# Patient Record
Sex: Female | Born: 1935 | Race: White | Hispanic: No | Marital: Single
Health system: Southern US, Community
[De-identification: ages and names within clinical notes are randomized; demographics above are authoritative.]

---

## 2005-05-31 ENCOUNTER — Emergency Department: Payer: Self-pay | Admitting: Unknown Physician Specialty

## 2005-06-03 ENCOUNTER — Emergency Department: Payer: Self-pay | Admitting: Emergency Medicine

## 2005-06-07 ENCOUNTER — Emergency Department: Payer: Self-pay | Admitting: Emergency Medicine

## 2005-06-14 ENCOUNTER — Emergency Department: Payer: Self-pay | Admitting: Emergency Medicine

## 2005-06-28 ENCOUNTER — Emergency Department: Payer: Self-pay | Admitting: Unknown Physician Specialty

## 2005-09-26 ENCOUNTER — Emergency Department: Payer: Self-pay | Admitting: Emergency Medicine

## 2005-09-27 ENCOUNTER — Inpatient Hospital Stay: Payer: Self-pay | Admitting: Internal Medicine

## 2005-09-27 ENCOUNTER — Other Ambulatory Visit: Payer: Self-pay

## 2006-03-09 ENCOUNTER — Ambulatory Visit: Payer: Self-pay

## 2006-03-14 ENCOUNTER — Ambulatory Visit: Payer: Self-pay

## 2006-04-07 ENCOUNTER — Ambulatory Visit: Payer: Self-pay | Admitting: Surgery

## 2006-04-13 ENCOUNTER — Ambulatory Visit: Payer: Self-pay | Admitting: Surgery

## 2006-05-02 ENCOUNTER — Ambulatory Visit: Payer: Self-pay | Admitting: Radiation Oncology

## 2006-05-02 ENCOUNTER — Ambulatory Visit: Payer: Self-pay | Admitting: Internal Medicine

## 2006-05-04 ENCOUNTER — Ambulatory Visit: Payer: Self-pay | Admitting: Radiation Oncology

## 2006-06-04 ENCOUNTER — Ambulatory Visit: Payer: Self-pay | Admitting: Radiation Oncology

## 2006-07-04 ENCOUNTER — Ambulatory Visit: Payer: Self-pay | Admitting: Radiation Oncology

## 2006-08-04 ENCOUNTER — Ambulatory Visit: Payer: Self-pay | Admitting: Radiation Oncology

## 2006-10-04 ENCOUNTER — Ambulatory Visit: Payer: Self-pay | Admitting: Internal Medicine

## 2006-10-10 ENCOUNTER — Ambulatory Visit: Payer: Self-pay | Admitting: Internal Medicine

## 2006-11-04 ENCOUNTER — Ambulatory Visit: Payer: Self-pay | Admitting: Internal Medicine

## 2007-01-04 ENCOUNTER — Ambulatory Visit: Payer: Self-pay | Admitting: Radiation Oncology

## 2007-01-11 ENCOUNTER — Ambulatory Visit: Payer: Self-pay | Admitting: Radiation Oncology

## 2007-02-04 ENCOUNTER — Ambulatory Visit: Payer: Self-pay | Admitting: Radiation Oncology

## 2007-03-16 ENCOUNTER — Ambulatory Visit: Payer: Self-pay | Admitting: Surgery

## 2007-04-04 ENCOUNTER — Ambulatory Visit: Payer: Self-pay | Admitting: Internal Medicine

## 2007-05-04 ENCOUNTER — Ambulatory Visit: Payer: Self-pay | Admitting: Internal Medicine

## 2007-07-04 ENCOUNTER — Ambulatory Visit: Payer: Self-pay | Admitting: Radiation Oncology

## 2007-07-12 ENCOUNTER — Ambulatory Visit: Payer: Self-pay | Admitting: Internal Medicine

## 2007-08-04 ENCOUNTER — Ambulatory Visit: Payer: Self-pay | Admitting: Radiation Oncology

## 2007-10-04 ENCOUNTER — Ambulatory Visit: Payer: Self-pay | Admitting: Internal Medicine

## 2007-10-10 ENCOUNTER — Ambulatory Visit: Payer: Self-pay | Admitting: Internal Medicine

## 2007-11-04 ENCOUNTER — Ambulatory Visit: Payer: Self-pay | Admitting: Internal Medicine

## 2008-02-28 ENCOUNTER — Ambulatory Visit: Payer: Self-pay | Admitting: Otolaryngology

## 2008-03-12 ENCOUNTER — Ambulatory Visit: Payer: Self-pay | Admitting: Otolaryngology

## 2008-03-17 ENCOUNTER — Ambulatory Visit: Payer: Self-pay | Admitting: Surgery

## 2008-04-03 ENCOUNTER — Ambulatory Visit: Payer: Self-pay | Admitting: Internal Medicine

## 2008-04-09 ENCOUNTER — Ambulatory Visit: Payer: Self-pay | Admitting: Internal Medicine

## 2008-05-03 ENCOUNTER — Ambulatory Visit: Payer: Self-pay | Admitting: Internal Medicine

## 2008-07-03 ENCOUNTER — Ambulatory Visit: Payer: Self-pay | Admitting: Internal Medicine

## 2008-07-17 ENCOUNTER — Ambulatory Visit: Payer: Self-pay | Admitting: Internal Medicine

## 2008-08-03 ENCOUNTER — Ambulatory Visit: Payer: Self-pay | Admitting: Internal Medicine

## 2008-09-24 ENCOUNTER — Emergency Department: Payer: Self-pay | Admitting: Emergency Medicine

## 2008-10-01 ENCOUNTER — Emergency Department: Payer: Self-pay | Admitting: Emergency Medicine

## 2008-10-03 ENCOUNTER — Ambulatory Visit: Payer: Self-pay | Admitting: Internal Medicine

## 2008-10-09 ENCOUNTER — Inpatient Hospital Stay: Payer: Self-pay | Admitting: Internal Medicine

## 2008-10-24 ENCOUNTER — Ambulatory Visit: Payer: Self-pay | Admitting: Internal Medicine

## 2008-11-03 ENCOUNTER — Ambulatory Visit: Payer: Self-pay | Admitting: Internal Medicine

## 2009-01-04 ENCOUNTER — Ambulatory Visit: Payer: Self-pay | Admitting: Internal Medicine

## 2009-03-31 ENCOUNTER — Ambulatory Visit: Payer: Self-pay | Admitting: Surgery

## 2010-04-27 ENCOUNTER — Ambulatory Visit: Payer: Self-pay | Admitting: Surgery

## 2010-12-21 IMAGING — CR DG LUMBAR SPINE 2-3V
1 series · 3 of 3 positions shown · non-contrast
Comparison: none

REASON FOR EXAM: sciatica, low back pain
COMMENTS:

[Series 1: view not recorded · 0.17mm/px · 3 of 3 slices shown]
[im 1/3]
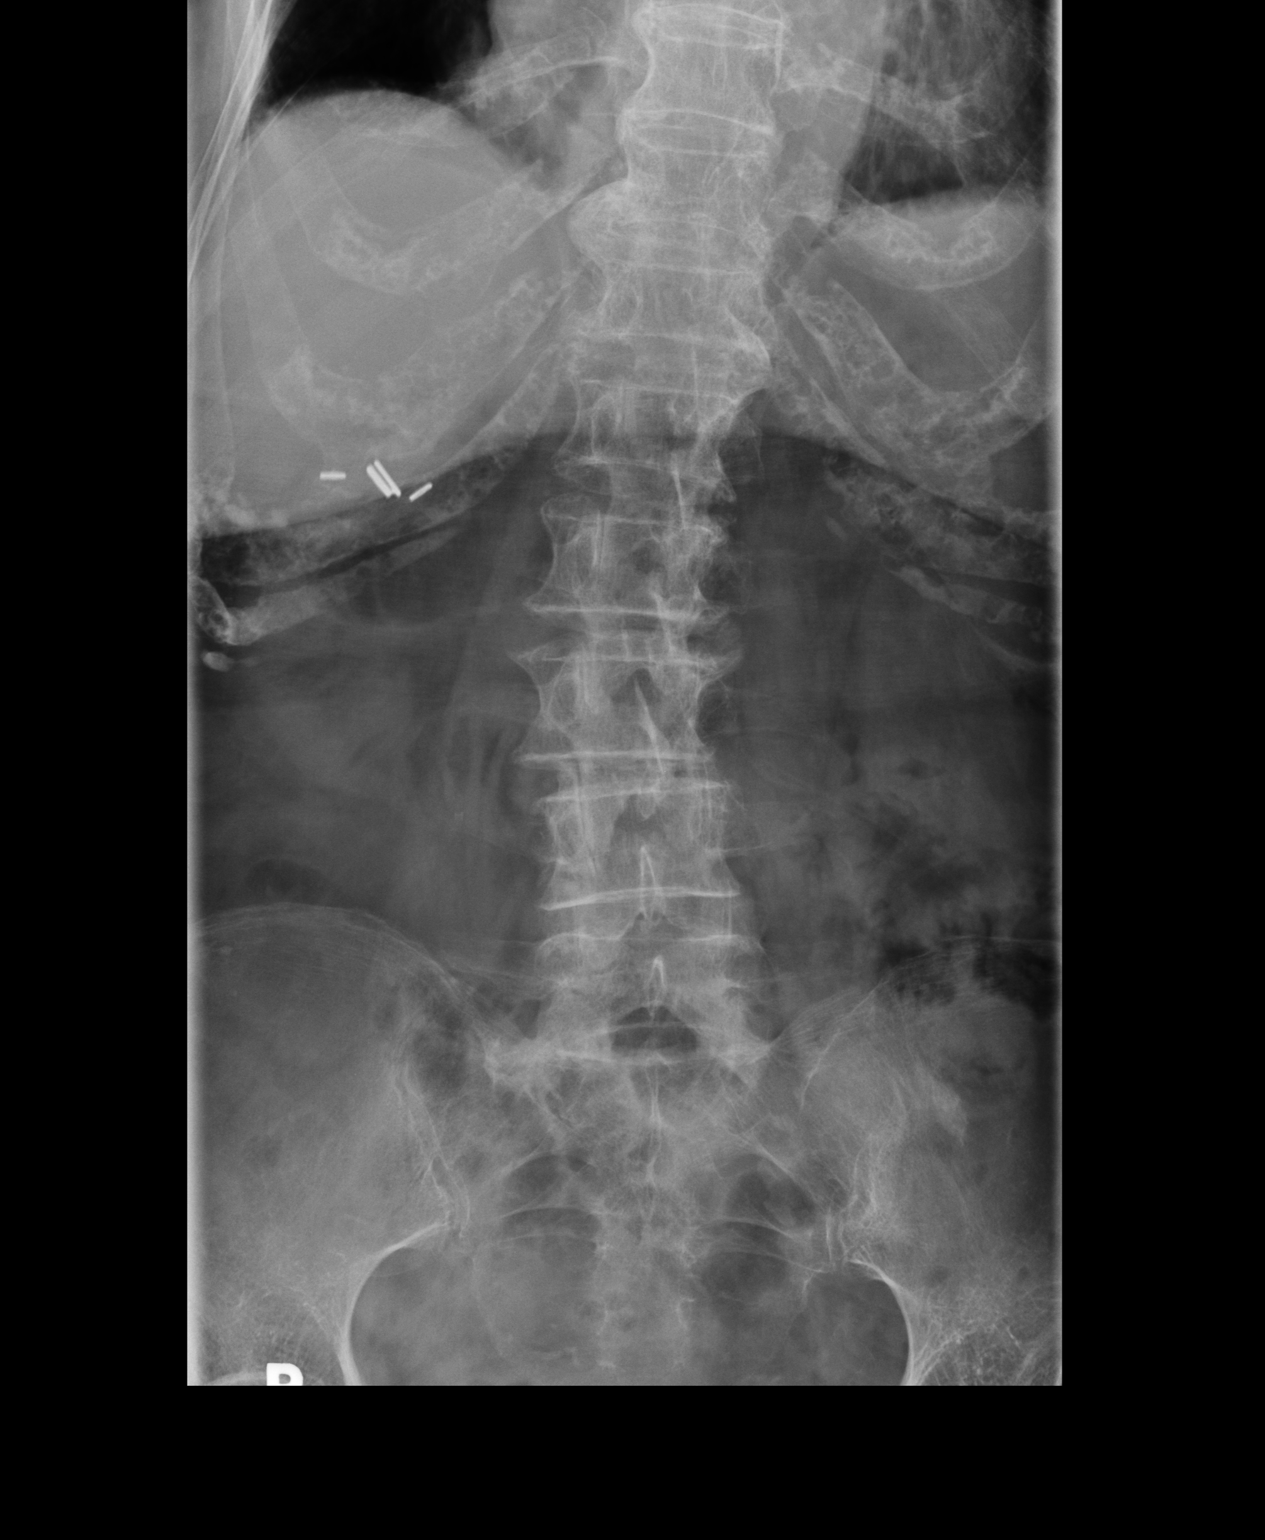
[im 2/3]
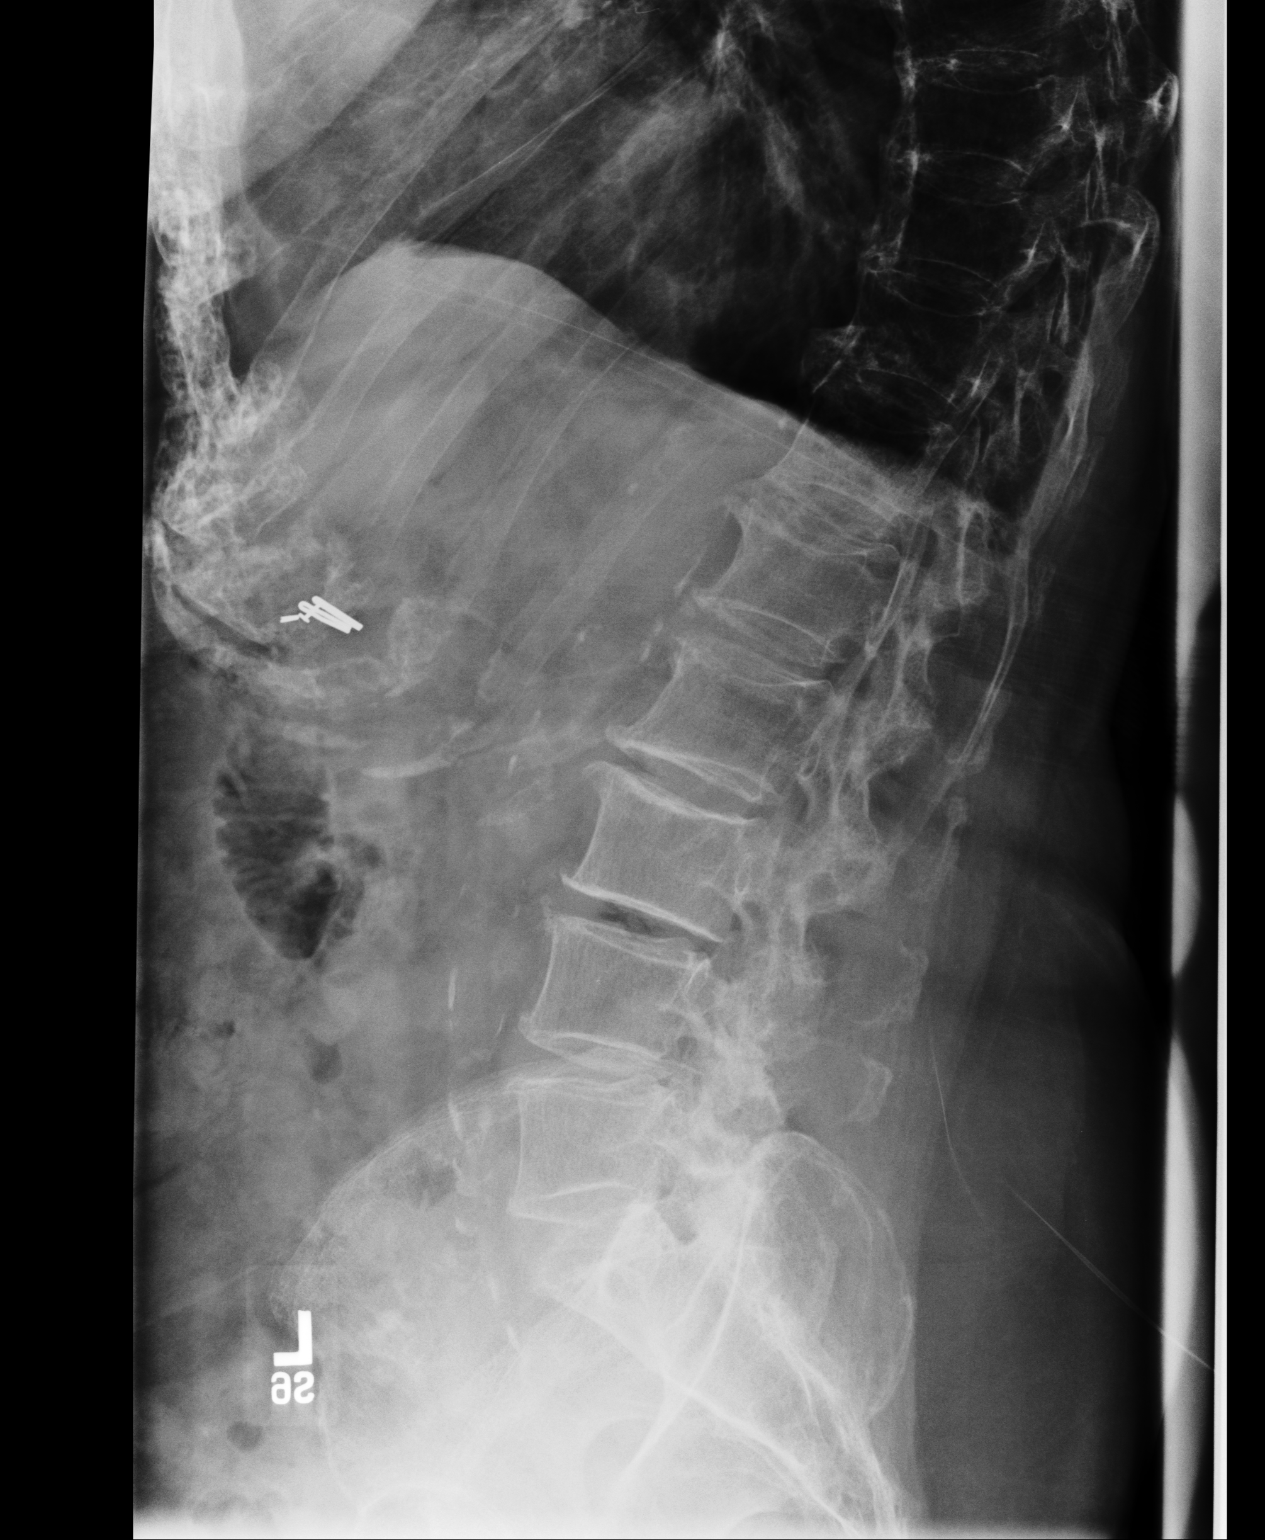
[im 3/3]
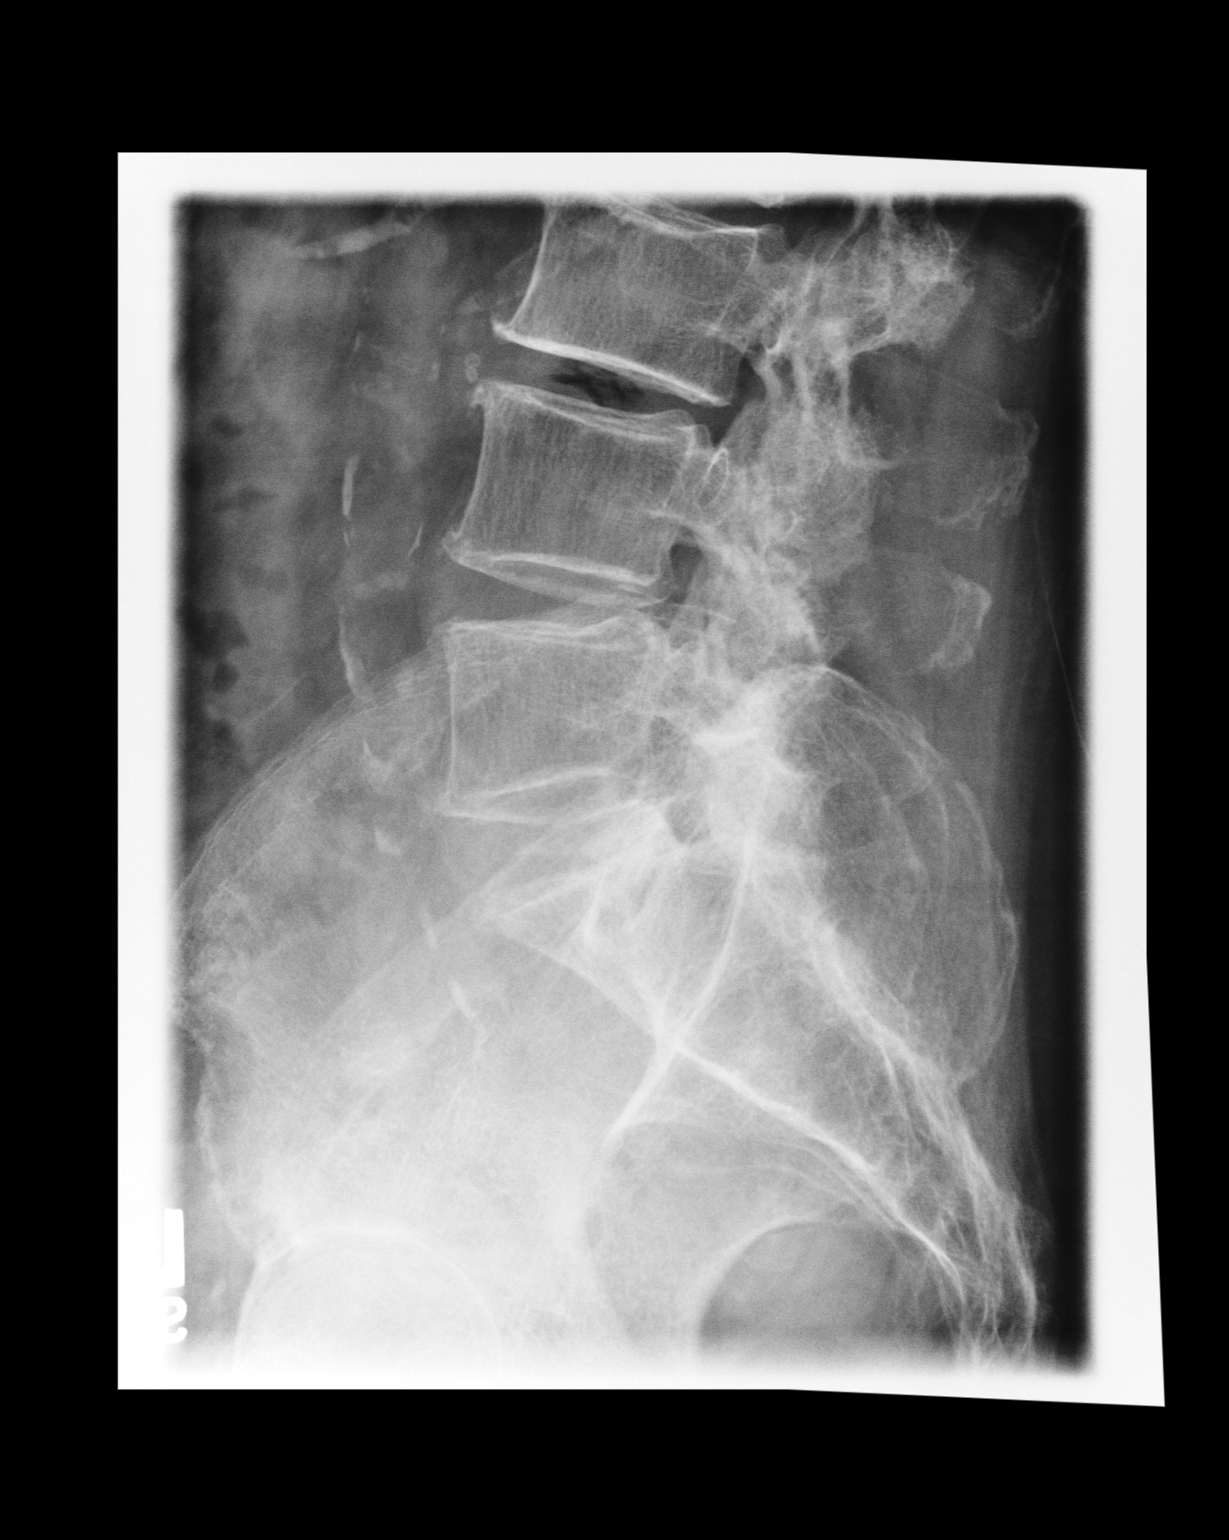

[3 of 3 positions shown; findings below may reference images not displayed]

PROCEDURE:     DXR - DXR LUMBAR SPINE AP AND LATERAL  - September 24, 2008 [DATE]

RESULT:     The lumbar vertebral bodies are preserved in height. There is
diffuse osteopenia. Gentle curvature of the lumbar spine with the convexity
to the right is present. The observed portions of the sacrum are normal.
IMPRESSION: There are mild degenerative disc change at multiple levels
a of the lumbar spine. There is no evidence of compression fracture.
Followup MRI may be useful if the patient's radicular symptoms persist.

## 2011-02-11 ENCOUNTER — Ambulatory Visit: Payer: Self-pay | Admitting: Internal Medicine

## 2011-02-15 ENCOUNTER — Ambulatory Visit: Payer: Self-pay | Admitting: Internal Medicine

## 2011-02-15 LAB — CBC CANCER CENTER
Basophil %: 0.5 %
Eosinophil %: 0.7 %
HGB: 14.8 g/dL (ref 12.0–16.0)
Lymphocyte %: 19 %
MCH: 31.3 pg (ref 26.0–34.0)
MCHC: 33.3 g/dL (ref 32.0–36.0)
Monocyte #: 0.5 x10 3/mm (ref 0.0–0.7)
Monocyte %: 6.9 %
Neutrophil %: 72.9 %
Platelet: 220 x10 3/mm (ref 150–440)

## 2011-02-15 LAB — HEPATIC FUNCTION PANEL A (ARMC)
Bilirubin, Direct: 0.1 mg/dL (ref 0.00–0.20)
Bilirubin,Total: 0.6 mg/dL (ref 0.2–1.0)
SGPT (ALT): 22 U/L

## 2011-02-15 LAB — PROTIME-INR: Prothrombin Time: 18.9 secs — ABNORMAL HIGH (ref 11.5–14.7)

## 2011-02-15 LAB — CREATININE, SERUM: EGFR (African American): >60

## 2011-03-04 ENCOUNTER — Ambulatory Visit: Payer: Self-pay | Admitting: Internal Medicine

## 2011-04-04 ENCOUNTER — Ambulatory Visit: Payer: Self-pay | Admitting: Internal Medicine

## 2011-04-08 LAB — PROTIME-INR: INR: 0.9

## 2011-04-13 LAB — PROTIME-INR: INR: 1.3

## 2011-04-28 ENCOUNTER — Ambulatory Visit: Payer: Self-pay | Admitting: Surgery

## 2011-05-04 ENCOUNTER — Ambulatory Visit: Payer: Self-pay | Admitting: Internal Medicine

## 2011-06-23 ENCOUNTER — Ambulatory Visit: Payer: Self-pay

## 2011-06-29 ENCOUNTER — Ambulatory Visit: Payer: Self-pay | Admitting: Internal Medicine

## 2011-06-29 LAB — CBC CANCER CENTER
Basophil #: 0 x10 3/mm (ref 0.0–0.1)
Basophil %: 0.5 %
Eosinophil #: 0.1 x10 3/mm (ref 0.0–0.7)
Eosinophil %: 1.1 %
HGB: 12.7 g/dL (ref 12.0–16.0)
Lymphocyte %: 15.3 %
MCV: 93 fL (ref 80–100)
Monocyte #: 0.6 x10 3/mm (ref 0.2–0.9)
Neutrophil %: 76 %
Platelet: 268 x10 3/mm (ref 150–440)
RDW: 13.9 % (ref 11.5–14.5)
WBC: 8.1 x10 3/mm (ref 3.6–11.0)

## 2011-06-29 LAB — PROTIME-INR: Prothrombin Time: 19 secs — ABNORMAL HIGH (ref 11.5–14.7)

## 2011-06-29 LAB — HEPATIC FUNCTION PANEL A (ARMC)
Albumin: 3.2 g/dL — ABNORMAL LOW (ref 3.4–5.0)
Alkaline Phosphatase: 84 U/L (ref 50–136)
Bilirubin,Total: 0.6 mg/dL (ref 0.2–1.0)
SGOT(AST): 22 U/L (ref 15–37)
SGPT (ALT): 28 U/L

## 2011-06-29 LAB — CREATININE, SERUM
Creatinine: 0.86 mg/dL (ref 0.60–1.30)
EGFR (African American): >60
EGFR (Non-African Amer.): 59 — ABNORMAL LOW

## 2011-07-04 ENCOUNTER — Ambulatory Visit: Payer: Self-pay | Admitting: Internal Medicine

## 2011-08-04 LAB — URINALYSIS, COMPLETE
Bilirubin,UR: NEGATIVE
Nitrite: NEGATIVE
Protein: 100
RBC,UR: 95 /HPF (ref 0–5)
Specific Gravity: 1.015 (ref 1.003–1.030)

## 2011-08-04 LAB — CBC
HCT: 40.4 % (ref 35.0–47.0)
HGB: 13.9 g/dL (ref 12.0–16.0)
MCH: 31.4 pg (ref 26.0–34.0)
Platelet: 138 10*3/uL — ABNORMAL LOW (ref 150–440)
RBC: 4.42 10*6/uL (ref 3.80–5.20)
WBC: 23.6 10*3/uL — ABNORMAL HIGH (ref 3.6–11.0)

## 2011-08-04 LAB — BASIC METABOLIC PANEL
BUN: 32 mg/dL — ABNORMAL HIGH (ref 7–18)
Calcium, Total: 8.5 mg/dL (ref 8.5–10.1)
Chloride: 106 mmol/L (ref 98–107)
Co2: 26 mmol/L (ref 21–32)
Creatinine: 1.24 mg/dL (ref 0.60–1.30)
EGFR (African American): 44 — ABNORMAL LOW
EGFR (Non-African Amer.): 38 — ABNORMAL LOW
Osmolality: 297 (ref 275–301)

## 2011-08-04 LAB — CK TOTAL AND CKMB (NOT AT ARMC)
CK, Total: 356 U/L — ABNORMAL HIGH (ref 21–215)
CK-MB: 3.2 ng/mL (ref 0.5–3.6)

## 2011-08-04 LAB — TROPONIN I: Troponin-I: 0.33 ng/mL — ABNORMAL HIGH

## 2011-08-05 ENCOUNTER — Inpatient Hospital Stay: Payer: Self-pay | Admitting: Family Medicine

## 2011-08-05 LAB — CK: CK, Total: 198 U/L (ref 21–215)

## 2011-08-05 LAB — TROPONIN I: Troponin-I: 0.23 ng/mL — ABNORMAL HIGH

## 2011-08-06 LAB — CBC WITH DIFFERENTIAL/PLATELET
Basophil #: 0 10*3/uL (ref 0.0–0.1)
Basophil %: 0.1 %
HGB: 12.8 g/dL (ref 12.0–16.0)
Lymphocyte %: 5.7 %
MCH: 30.9 pg (ref 26.0–34.0)
MCHC: 33.7 g/dL (ref 32.0–36.0)
MCV: 92 fL (ref 80–100)
Monocyte #: 0.7 x10 3/mm (ref 0.2–0.9)
Neutrophil #: 13.8 10*3/uL — ABNORMAL HIGH (ref 1.4–6.5)
Neutrophil %: 88.7 %
RBC: 4.14 10*6/uL (ref 3.80–5.20)
RDW: 14.6 % — ABNORMAL HIGH (ref 11.5–14.5)

## 2011-08-06 LAB — BUN: BUN: 27 mg/dL — ABNORMAL HIGH (ref 7–18)

## 2011-08-06 LAB — MAGNESIUM: Magnesium: 1.8 mg/dL

## 2011-08-09 LAB — URINE CULTURE

## 2012-04-30 ENCOUNTER — Ambulatory Visit: Payer: Self-pay | Admitting: Internal Medicine

## 2012-05-02 ENCOUNTER — Ambulatory Visit: Payer: Self-pay | Admitting: Internal Medicine

## 2012-07-11 ENCOUNTER — Inpatient Hospital Stay: Payer: Self-pay | Admitting: Internal Medicine

## 2012-07-11 LAB — COMPREHENSIVE METABOLIC PANEL WITH GFR
Albumin: 2.5 g/dL — ABNORMAL LOW
Alkaline Phosphatase: 74 U/L
Anion Gap: 7
BUN: 26 mg/dL — ABNORMAL HIGH
Bilirubin,Total: 0.8 mg/dL
Calcium, Total: 9.1 mg/dL
Chloride: 106 mmol/L
Co2: 29 mmol/L
Creatinine: 0.94 mg/dL
EGFR (African American): >60
EGFR (Non-African Amer.): 53 — ABNORMAL LOW
Glucose: 97 mg/dL
Osmolality: 288
Potassium: 3.2 mmol/L — ABNORMAL LOW
SGOT(AST): 19 U/L
SGPT (ALT): 16 U/L
Sodium: 142 mmol/L
Total Protein: 6.3 g/dL — ABNORMAL LOW

## 2012-07-11 LAB — CBC
HCT: 34.2 % — ABNORMAL LOW (ref 35.0–47.0)
HGB: 11.3 g/dL — ABNORMAL LOW (ref 12.0–16.0)
MCH: 30.2 pg (ref 26.0–34.0)
MCHC: 33 g/dL (ref 32.0–36.0)
Platelet: 197 10*3/uL (ref 150–440)
WBC: 9.8 10*3/uL (ref 3.6–11.0)

## 2012-07-11 LAB — URINALYSIS, COMPLETE: Squamous Epithelial: NONE SEEN

## 2012-07-11 LAB — TROPONIN I: Troponin-I: <0.02 ng/mL

## 2012-07-11 LAB — PROTIME-INR: Prothrombin Time: 35.6 secs — ABNORMAL HIGH (ref 11.5–14.7)

## 2012-07-12 LAB — CBC WITH DIFFERENTIAL/PLATELET
Basophil %: 0.2 %
Eosinophil #: 0 10*3/uL (ref 0.0–0.7)
Eosinophil %: 0.1 %
HCT: 33.6 % — ABNORMAL LOW (ref 35.0–47.0)
Lymphocyte #: 1.1 10*3/uL (ref 1.0–3.6)
Lymphocyte %: 10.8 %
MCH: 31 pg (ref 26.0–34.0)
MCHC: 34.1 g/dL (ref 32.0–36.0)
Monocyte #: 0.9 x10 3/mm (ref 0.2–0.9)
Neutrophil #: 8.4 10*3/uL — ABNORMAL HIGH (ref 1.4–6.5)
Platelet: 206 10*3/uL (ref 150–440)
RDW: 14.6 % — ABNORMAL HIGH (ref 11.5–14.5)
WBC: 10.4 10*3/uL (ref 3.6–11.0)

## 2012-07-12 LAB — BASIC METABOLIC PANEL
Chloride: 107 mmol/L (ref 98–107)
Co2: 27 mmol/L (ref 21–32)
Creatinine: 1.07 mg/dL (ref 0.60–1.30)
EGFR (Non-African Amer.): 45 — ABNORMAL LOW
Potassium: 3.3 mmol/L — ABNORMAL LOW (ref 3.5–5.1)

## 2012-07-13 LAB — CBC WITH DIFFERENTIAL/PLATELET
Basophil %: 0.2 %
Eosinophil #: 0 10*3/uL (ref 0.0–0.7)
HCT: 32.7 % — ABNORMAL LOW (ref 35.0–47.0)
HGB: 10.9 g/dL — ABNORMAL LOW (ref 12.0–16.0)
Lymphocyte #: 1 10*3/uL (ref 1.0–3.6)
MCV: 91 fL (ref 80–100)
Monocyte #: 0.8 x10 3/mm (ref 0.2–0.9)
Monocyte %: 6.9 %
Platelet: 216 10*3/uL (ref 150–440)
RBC: 3.59 10*6/uL — ABNORMAL LOW (ref 3.80–5.20)

## 2012-07-13 LAB — BASIC METABOLIC PANEL
Anion Gap: 8 (ref 7–16)
BUN: 20 mg/dL — ABNORMAL HIGH (ref 7–18)
Calcium, Total: 9.1 mg/dL (ref 8.5–10.1)
Chloride: 107 mmol/L (ref 98–107)
Creatinine: 0.93 mg/dL (ref 0.60–1.30)
EGFR (Non-African Amer.): 54 — ABNORMAL LOW
Glucose: 102 mg/dL — ABNORMAL HIGH (ref 65–99)
Osmolality: 282 (ref 275–301)
Potassium: 3.6 mmol/L (ref 3.5–5.1)
Sodium: 140 mmol/L (ref 136–145)

## 2012-07-13 LAB — URINE CULTURE

## 2012-07-13 LAB — PROTIME-INR: Prothrombin Time: 38.6 secs — ABNORMAL HIGH (ref 11.5–14.7)

## 2012-07-14 LAB — BASIC METABOLIC PANEL
Anion Gap: 3 — ABNORMAL LOW (ref 7–16)
BUN: 16 mg/dL (ref 7–18)
Calcium, Total: 8.9 mg/dL (ref 8.5–10.1)
Chloride: 112 mmol/L — ABNORMAL HIGH (ref 98–107)
Co2: 29 mmol/L (ref 21–32)
Creatinine: 1.02 mg/dL (ref 0.60–1.30)
EGFR (African American): 56 — ABNORMAL LOW
Glucose: 87 mg/dL (ref 65–99)
Osmolality: 287 (ref 275–301)

## 2012-07-14 LAB — CBC WITH DIFFERENTIAL/PLATELET
Basophil #: 0 10*3/uL (ref 0.0–0.1)
Basophil %: 0.4 %
HGB: 10.7 g/dL — ABNORMAL LOW (ref 12.0–16.0)
Lymphocyte #: 1.2 10*3/uL (ref 1.0–3.6)
Lymphocyte %: 15.7 %
MCH: 30.6 pg (ref 26.0–34.0)
MCV: 92 fL (ref 80–100)
Monocyte %: 8.5 %
Neutrophil #: 5.8 10*3/uL (ref 1.4–6.5)
Neutrophil %: 73.9 %
RDW: 14.2 % (ref 11.5–14.5)
WBC: 7.8 10*3/uL (ref 3.6–11.0)

## 2012-07-14 LAB — PROTIME-INR
INR: 2.5
Prothrombin Time: 26.2 secs — ABNORMAL HIGH (ref 11.5–14.7)

## 2012-07-15 LAB — PROTIME-INR
INR: 1.6
Prothrombin Time: 18.7 secs — ABNORMAL HIGH (ref 11.5–14.7)

## 2012-07-16 LAB — CULTURE, BLOOD (SINGLE)

## 2012-08-28 ENCOUNTER — Emergency Department: Payer: Self-pay | Admitting: Emergency Medicine

## 2012-08-30 ENCOUNTER — Inpatient Hospital Stay: Payer: Self-pay | Admitting: Internal Medicine

## 2012-08-30 LAB — CBC WITH DIFFERENTIAL/PLATELET
Basophil %: 0.4 %
Eosinophil %: 0.2 %
HCT: 36.4 % (ref 35.0–47.0)
HGB: 12.2 g/dL (ref 12.0–16.0)
HGB: 13.7 g/dL (ref 12.0–16.0)
Lymphocyte #: 0.9 10*3/uL — ABNORMAL LOW (ref 1.0–3.6)
Lymphocyte #: 1 10*3/uL (ref 1.0–3.6)
Lymphocyte %: 8.9 %
MCH: 31 pg (ref 26.0–34.0)
MCHC: 33.5 g/dL (ref 32.0–36.0)
MCV: 93 fL (ref 80–100)
MCV: 94 fL (ref 80–100)
Monocyte #: 0.8 x10 3/mm (ref 0.2–0.9)
Monocyte %: 6.6 %
Neutrophil #: 10.6 10*3/uL — ABNORMAL HIGH (ref 1.4–6.5)
Neutrophil %: 84.1 %
Platelet: 209 10*3/uL (ref 150–440)
RBC: 3.92 10*6/uL (ref 3.80–5.20)
RDW: 15.7 % — ABNORMAL HIGH (ref 11.5–14.5)
WBC: 12.4 10*3/uL — ABNORMAL HIGH (ref 3.6–11.0)

## 2012-08-30 LAB — URINALYSIS, COMPLETE
Bilirubin,UR: NEGATIVE
Glucose,UR: NEGATIVE mg/dL (ref 0–75)
Hyaline Cast: 2
Ph: 6 (ref 4.5–8.0)
RBC,UR: 30 /HPF (ref 0–5)
Specific Gravity: 1.017 (ref 1.003–1.030)
Squamous Epithelial: 1

## 2012-08-30 LAB — COMPREHENSIVE METABOLIC PANEL
BUN: 20 mg/dL — ABNORMAL HIGH (ref 7–18)
Bilirubin,Total: 0.4 mg/dL (ref 0.2–1.0)
Chloride: 103 mmol/L (ref 98–107)
Co2: 28 mmol/L (ref 21–32)
Creatinine: 1.17 mg/dL (ref 0.60–1.30)
EGFR (African American): 47 — ABNORMAL LOW
SGOT(AST): 21 U/L (ref 15–37)
SGPT (ALT): 18 U/L (ref 12–78)
Sodium: 139 mmol/L (ref 136–145)
Total Protein: 6.4 g/dL (ref 6.4–8.2)

## 2012-08-30 LAB — BASIC METABOLIC PANEL
Co2: 26 mmol/L (ref 21–32)
EGFR (African American): 55 — ABNORMAL LOW
EGFR (Non-African Amer.): 47 — ABNORMAL LOW
Osmolality: 280 (ref 275–301)
Potassium: 3.6 mmol/L (ref 3.5–5.1)
Sodium: 139 mmol/L (ref 136–145)

## 2012-08-30 LAB — PROTIME-INR: INR: 1

## 2012-08-31 LAB — BASIC METABOLIC PANEL
Anion Gap: 7 (ref 7–16)
Chloride: 109 mmol/L — ABNORMAL HIGH (ref 98–107)
Creatinine: 0.93 mg/dL (ref 0.60–1.30)
EGFR (African American): >60
Glucose: 98 mg/dL (ref 65–99)
Potassium: 4.1 mmol/L (ref 3.5–5.1)
Sodium: 140 mmol/L (ref 136–145)

## 2012-08-31 LAB — CBC WITH DIFFERENTIAL/PLATELET
Basophil #: 0 10*3/uL (ref 0.0–0.1)
Eosinophil #: 0 10*3/uL (ref 0.0–0.7)
Eosinophil %: 0.5 %
HGB: 11 g/dL — ABNORMAL LOW (ref 12.0–16.0)
Lymphocyte #: 0.7 10*3/uL — ABNORMAL LOW (ref 1.0–3.6)
Lymphocyte %: 7.6 %
MCHC: 33.7 g/dL (ref 32.0–36.0)
Monocyte #: 1.1 x10 3/mm — ABNORMAL HIGH (ref 0.2–0.9)
Monocyte %: 11.1 %
Neutrophil #: 7.9 10*3/uL — ABNORMAL HIGH (ref 1.4–6.5)

## 2012-09-01 LAB — HEMOGLOBIN: HGB: 11.7 g/dL — ABNORMAL LOW (ref 12.0–16.0)

## 2012-09-02 LAB — CBC WITH DIFFERENTIAL/PLATELET
Basophil #: 0.1 10*3/uL (ref 0.0–0.1)
Basophil %: 0.6 %
Eosinophil #: 0.1 10*3/uL (ref 0.0–0.7)
Eosinophil %: 1.3 %
HGB: 12 g/dL (ref 12.0–16.0)
Lymphocyte #: 1.1 10*3/uL (ref 1.0–3.6)
MCH: 31.8 pg (ref 26.0–34.0)
MCHC: 34 g/dL (ref 32.0–36.0)
Monocyte #: 0.8 x10 3/mm (ref 0.2–0.9)
Monocyte %: 7.6 %
Neutrophil #: 8.4 10*3/uL — ABNORMAL HIGH (ref 1.4–6.5)
RDW: 16 % — ABNORMAL HIGH (ref 11.5–14.5)
WBC: 10.5 10*3/uL (ref 3.6–11.0)

## 2012-09-03 ENCOUNTER — Ambulatory Visit: Payer: Self-pay | Admitting: Nurse Practitioner

## 2012-09-03 LAB — CBC WITH DIFFERENTIAL/PLATELET
Basophil %: 0.8 %
Eosinophil #: 0.2 10*3/uL (ref 0.0–0.7)
Eosinophil %: 2 %
HCT: 34.1 % — ABNORMAL LOW (ref 35.0–47.0)
HGB: 11.4 g/dL — ABNORMAL LOW (ref 12.0–16.0)
Lymphocyte %: 12.5 %
MCH: 31.6 pg (ref 26.0–34.0)
MCV: 94 fL (ref 80–100)
Monocyte %: 9 %
Neutrophil #: 6.8 10*3/uL — ABNORMAL HIGH (ref 1.4–6.5)
Neutrophil %: 75.7 %
Platelet: 207 10*3/uL (ref 150–440)
RDW: 15.9 % — ABNORMAL HIGH (ref 11.5–14.5)
WBC: 8.9 10*3/uL (ref 3.6–11.0)

## 2012-09-03 LAB — BASIC METABOLIC PANEL
Anion Gap: 5 — ABNORMAL LOW (ref 7–16)
BUN: 19 mg/dL — ABNORMAL HIGH (ref 7–18)
Calcium, Total: 8.9 mg/dL (ref 8.5–10.1)
Chloride: 109 mmol/L — ABNORMAL HIGH (ref 98–107)
Creatinine: 0.75 mg/dL (ref 0.60–1.30)
EGFR (African American): >60
Glucose: 109 mg/dL — ABNORMAL HIGH (ref 65–99)
Osmolality: 284 (ref 275–301)
Potassium: 3.3 mmol/L — ABNORMAL LOW (ref 3.5–5.1)
Sodium: 141 mmol/L (ref 136–145)

## 2012-09-04 LAB — CBC WITH DIFFERENTIAL/PLATELET
Basophil #: 0.1 10*3/uL (ref 0.0–0.1)
HCT: 33.6 % — ABNORMAL LOW (ref 35.0–47.0)
HGB: 11.2 g/dL — ABNORMAL LOW (ref 12.0–16.0)
Lymphocyte #: 1.2 10*3/uL (ref 1.0–3.6)
MCH: 31.4 pg (ref 26.0–34.0)
MCV: 95 fL (ref 80–100)
Neutrophil #: 6.3 10*3/uL (ref 1.4–6.5)
Neutrophil %: 74.1 %
Platelet: 209 10*3/uL (ref 150–440)
RDW: 15.8 % — ABNORMAL HIGH (ref 11.5–14.5)
WBC: 8.5 10*3/uL (ref 3.6–11.0)

## 2012-09-04 LAB — BASIC METABOLIC PANEL
Anion Gap: 2 — ABNORMAL LOW (ref 7–16)
BUN: 22 mg/dL — ABNORMAL HIGH (ref 7–18)
Calcium, Total: 9.2 mg/dL (ref 8.5–10.1)
Chloride: 109 mmol/L — ABNORMAL HIGH (ref 98–107)
Co2: 30 mmol/L (ref 21–32)
Creatinine: 0.81 mg/dL (ref 0.60–1.30)
EGFR (African American): >60
Glucose: 106 mg/dL — ABNORMAL HIGH (ref 65–99)
Osmolality: 285 (ref 275–301)
Sodium: 141 mmol/L (ref 136–145)

## 2012-10-03 DEATH — deceased

## 2014-04-22 NOTE — H&P (Signed)
PATIENT NAME:  Jasmin Wang, Jasmin Wang MR#:  [id number] DATE OF BIRTH:  1919/06/17  DATE OF ADMISSION:  08/05/2011  PRIMARY CARE PHYSICIAN:  Dr. Yates Decamp REFERRING PHYSICIAN:  Dr. Margarita Grizzle  CHIEF COMPLAINT: Fall yesterday.   HISTORY OF PRESENT ILLNESS: The patient is a 81 year old Caucasian female with a past medical history of early stage right breast cancer, DCIS, hypertension, vertigo, bronchitis, history of right leg deep venous thrombosis on Coumadin for about two years, who presented to the ED with fall yesterday. The patient is alert, awake, oriented but has hearing loss. According to her and her nephew, the patient fell by accident at home. She said she tripped over a chair by accident and fell, but she denies any syncope, loss of consciousness, or seizure. She feels dizzy but denies any other symptoms. According to her nephew, the patient fell before about three months ago and got bruises on her face due to taking Coumadin so Coumadin was stopped temporarily. Then the patient was resumed Coumadin later. In the ED the patient was noted to have a urinary tract infection and dehydration.  She was treated with Rocephin and IV fluid support.   PAST MEDICAL HISTORY:  1. Breast cancer.  2. Hypertension. 3. Vertigo. 4. Right leg deep venous thrombosis  5. Bronchitis.   PAST SURGICAL HISTORY:  1. Right breast cancer lumpectomy in 2008.  2. Cholecystectomy.  3. Appendectomy.  4. Cataract surgery.  5. Hernia repair.   FAMILY HISTORY:  Hypertension.   SOCIAL HISTORY: No smoking, alcohol drinking, or illicit drugs. The patient lives alone.    REVIEW OF SYSTEMS: CONSTITUTIONAL: The patient denies any fever or chills. No headache but has dizziness. No weakness. EYES: No double vision, blurred vision, but has history of cataract. ENT: No epistaxis, postnasal drip, dysphagia, or slurred speech. RESPIRATORY: No cough, sputum, shortness of breath, or hemoptysis. CARDIOVASCULAR: No chest pain, palpitation,  or orthopnea. No nocturnal dyspnea. No leg edema.  GI: No abdominal pain, nausea, vomiting, or diarrhea. No melena or bloody stool. GU: No dysuria or hematuria. No incontinence. ENDOCRINE: No polyuria or polydipsia. HEMATOLOGY: No easy bruising or bleeding. NEURO: No syncope, loss of consciousness, or seizure.   ALLERGIES:  Codeine.  HOME MEDICATIONS:  1. Vitamin D3 2000 units p.o. daily.  2. Vitamin C 500 mg p.o. daily.  3. Klor-Con M 20 tablets 1 tablet p.o. daily.  4. Lasix 20 mg p.o. daily.  5. Coumadin 1 mg p.o. daily.   PHYSICAL EXAMINATION:  VITALS: Temperature 98.4, blood pressure 102/50, pulse 67, respirations 18, oxygen saturation 97% on room air.   GENERAL: The patient is alert, awake, oriented, in no acute distress.   HEENT: Pupils round, equal, and reactive to light and accommodation.   NECK: Supple. No JVD or carotid bruit. No lymphadenopathy. No thyromegaly. Dry oral mucosa. Clear oropharynx.   CARDIOVASCULAR: S1, S2, regular rate and rhythm. No murmurs or gallops.   PULMONARY: Bilateral air entry. No wheezing or rales. No use of accessory muscles to breathe.   ABDOMEN: Soft. No distention or tenderness. No organomegaly. Bowel sounds present.   EXTREMITIES: No edema, clubbing, or cyanosis. No calf tenderness. Strong pedal pulses.   SKIN: No rash or jaundice.   NEUROLOGIC: Alert and oriented times three. No focal deficits. Deep tendon reflexes 2+. Power 5/5. Sensation intact.  LABORATORY, DIAGNOSTIC, AND RADIOLOGICAL DATA: Urinalysis shows RBC 95, WBC 74, nitrite negative. Glucose 123, BUN 32, creatinine 1.24, potassium 3.3, chloride 106, bicarbonate 26. WBC 23.6, hemoglobin 13.9, platelets 138. Troponin  0.33, CK 356. EKG shows sinus rhythm with marked sinus arrhythmia at 76 beats per minute.   IMPRESSION:  1. Fall.  2. Urinary tract infection.  3. Leukocytosis.  4. Dehydration.  5. Hypokalemia.  6. Elevated troponin.   PLAN OF TREATMENT:  1. The patient will  be admitted to the medical floor. We will start fall precautions. Hold Coumadin due to high risk of bleeding and high risk of falls, but we will start aspirin 325 mg p.o. daily due to elevated troponin. Also we will follow up troponin level. Elevated troponin level is possibly due to mild rhabdomyolysis because of fall.  2. For urinary tract infection we will continue Rocephin and follow up a urine culture and CBC.  3. For borderline low blood pressure and dehydration, we will start normal saline and follow up BMP.  4. For hypokalemia, we will give Klor-Con,  follow up BMP and magnesium level.  5. The patient is a 81 year old Caucasian female living alone and may need nursing home placement. I discussed the patient's situation and plan of treatment with the patient and the patient's nephew.   TIME SPENT: About 55 minutes.   ____________________________ Shaune PollackQing Asjah Rauda, MD qc:bjt D:  08/05/2011 01:36:38 ET         T: 08/05/2011 06:53:49 ET          JOB#: [id number]  cc: Jonny RuizJohn B. Danne HarborWalker III, MD Shaune PollackQING Kierria Feigenbaum MD ELECTRONICALLY SIGNED 08/06/2011 16:43

## 2014-04-22 NOTE — Discharge Summary (Signed)
PATIENT NAME:  Jasmin Wang, Jasmin Wang MR#:  [id number] DATE OF BIRTH:  February 21, 1919  DATE OF ADMISSION:  08/05/2011 DATE OF DISCHARGE:  08/08/2011   DISCHARGE DIAGNOSES:  1. Klebsiella urinary tract infection with metabolic encephalopathy.  2. History of falls risk.  3. History of hypertension.  4. History of deep vein thrombosis, on Coumadin.   DISCHARGE MEDICATIONS:  1. Vitamin D3 2000 international units daily.  2. Vitamin C 500 mg p.o. daily.  3. Furosemide 20 mg p.o. daily.  4. Potassium chloride 20 mEq p.o. daily. 5. Coumadin 1 mg p.o. daily.  6. Artificial tears as directed.  7. Cipro 250 p.o. b.i.d. x7 days.  CONSULTS: None.   PROCEDURES: None.   PERTINENT LABS: Noncontributory.   BRIEF HOSPITAL COURSE:  1. Encephalopathy secondary to urinary tract infection. The patient initially came in with concerns of altered mental status secondary to acute urinary tract infection seen on her labs. She had a white blood cell count of 23.6 upon admission with a urinalysis that was positive for white blood cells and red blood cells. She was treated with Rocephin upon her admission. She was transitioned over to oral Cipro prior to discharge. During her hospitalization she was evaluated by physical therapy who noted she was an increased falls risk. She is going to be transferred to [facility] further rehab and evaluation.  2. Other chronic medical issues are stable. Continue those home regimens.   DISPOSITION: She is in stable condition to be discharged to [facility] for further rehab and therapy for her falls risk. She needs follow-up with Dr. Dan HumphreysWalker within one week.    ____________________________ Marisue IvanKanhka Keyontae Huckeby, MD kl:drc D: 08/08/2011 12:40:51 ET T: 08/08/2011 12:57:35 ET JOB#: [id number]  cc: Marisue IvanKanhka Britt Theard, MD, <Dictator> Marisue IvanKANHKA Tyrhonda Georgiades MD ELECTRONICALLY SIGNED 09/01/2011 11:00

## 2014-04-25 NOTE — Op Note (Signed)
PATIENT NAME:  Jasmin BaldyHOMAS, Blandina MR#:  [id number] DATE OF BIRTH:  05/18/1919  DATE OF PROCEDURE:  08/30/2012  PREOPERATIVE DIAGNOSIS:  High intertroch/low basilar neck, right hip fracture.   POSTOPERATIVE DIAGNOSIS:  High intertroch/low basilar neck, right hip fracture.   PROCEDURE: ORIF, right intertrochanteric hip fracture.   ANESTHESIA: Spinal.   SURGEON: Leitha SchullerMichael J. Staphanie Harbison, M.D.   DESCRIPTION OF PROCEDURE: The patient was brought to the operating room and after adequate spinal anesthesia was obtained, the patient was placed on the fracture table with the left leg in well legholder.  The right foot was in the traction boot with traction applied. C-arm was brought in and essentially anatomic alignment was obtained in both AP and lateral projections. After prepping and draping, using the barrier drape method, a timeout procedure was completed. A proximal incision was made just proximal to the greater trochanter and a guidewire was inserted into the tip of the trochanter. Proximal reaming was carried out, and then a long guidewire inserted down the canal for the Affixus rod.  11 mm reaming did give some chatter in the mid canal, so a 9 mm rod was chosen. Depth based off of the guidewire that had been inserted. The 9 x 300 mm long Affixus right rod was then inserted down the canal and set at the appropriate level. A lateral incision was made, and a guidewire inserted in a center/center position, along with the drill for the antirotation screw to try to prevent rotation with the high intertroch fracture. The reaming was carried out over the guidewire with 100 mm screw chosen. The screw was inserted, and traction released. The screw was set and tightened and then quarter turned to release the tension on the screw to allow for sliding compression fixation. An antirotation screw was inserted; however, the bone was so soft it came out with the screwdriver, and so it was left aside, as the drill had done its job of  preventing rotation of the proximal fragment. The wound was thoroughly irrigated. The deep tissue was closed using #1 Vicryl, 2-0 Vicryl subQ and skin staples. Xeroform, 4 x 4's, ABD  and tape applied. The patient was sent to the recovery room in stable condition.   ESTIMATED BLOOD LOSS: 200 mL.   COMPLICATIONS: None.   SPECIMEN: None.   IMPLANTS: [employer]omet Affixus right long femoral rod 9 x 300 mm with a 100 mm lag screw.     ____________________________ Leitha SchullerMichael J. Ryen Rhames, MD mjm:dmm D: 08/30/2012 16:04:36 ET T: 08/30/2012 20:17:47 ET JOB#: [id number]  cc: Leitha SchullerMichael J. Yania Bogie, MD, <Dictator> Leitha SchullerMICHAEL J Onya Eutsler MD ELECTRONICALLY SIGNED 09/02/2012 18:34

## 2014-04-25 NOTE — H&P (Signed)
PATIENT NAME:  Jasmin Wang, Jasmin Wang MR#:  [id number] DATE OF BIRTH:  1919-06-13  DATE OF ADMISSION:  08/30/2012  PRIMARY CARE PHYSICIAN:  Dr. Yates DecampJohn Walker.  REFERRING PHYSICIAN:  Dr. Silverio LayYao from the Emergency Department.  HISTORY OF PRESENT ILLNESS:  Jasmin Wang is a 81 year old Caucasian female who is presenting from a nursing facility, has past medical history significant for profound dementia, hypertension as well as a DVT back in 2008 who has been off anticoagulation who is presenting after an unwitnessed fall at her nursing home, found to have an acute right hip pain with shortening.  She is evaluated by the Emergency Department by orthopedics who are planning surgical intervention for her right femoral neck fracture.  Currently she is unable to provide further review of systems secondary to mental status.    PAST MEDICAL HISTORY:  Dementia, hypertension, history of breast cancer, vertigo as well as a right lower extremity DVT and was previously on Coumadin therapy.    PAST SURGICAL HISTORY:  Right breast cancer status post lumpectomy in 2008, cholecystectomy, appendectomy, cataract surgery and hernia.   FAMILY HISTORY:  Significant for hypertension.  SOCIAL HISTORY:  Denies alcohol, tobacco or drug usage.  The patient lives in a nursing facility.    ALLERGIES:  CODEINE.   HOME MEDICATIONS:  Include pantoprazole 40 mg by mouth daily, metoprolol 25 mg by mouth twice daily, vitamin C 500 mg by mouth daily, vitamin D3 1000 international units by mouth daily, Tylenol 325 mg 2 tablets by mouth q. 4 hours as needed for pain or temperature, hydrochlorothiazide 12.5 mg by mouth daily.  PHYSICAL EXAMINATION:  VITAL SIGNS:  Temperature 97.9, heart rate 47, respirations 16, blood pressure 105/58, saturating 96% on 2 liters nasal cannula.   GENERAL:  No acute distress, markedly frail, elderly female. HEENT:  Normocephalic, atraumatic.  Extraocular muscles unable to assess secondary to patient's noncompliance and  mental status.  Dry mucosal membranes.  CARDIOVASCULAR:  S1, S2, sinus bradycardic.  No murmurs, rubs or gallops. PULMONARY:  Clear to auscultation bilaterally without wheezes, rales or rhonchi.  ABDOMEN:  Soft, nontender, nondistended.  Positive bowel sounds.   EXTREMITIES:  No cyanosis, edema or clubbing.  Right lower extremity shortened and externally rotated.   NEUROLOGIC:  Unable to assess patient's mental status.  She is able to respond in simple one-word answers though oriented x 0.   LABORATORY DATA:  Sodium 139, potassium 3.3, chloride 103, bicarb 28, BUN 20, creatinine 1.17, WBC is 10.7, hemoglobin 13.7, hematocrit 41.5, platelets 209, INR 1, prothrombin 13.7.  ASSESSMENT AND PLAN:  A 81 year old Caucasian female who is presenting from a nursing facility with a history of dementia, hypertension as well as a history of deep vein thrombosis back in 2008 who is off anticoagulation, presenting with an unwitnessed fall at the nursing facility, found to have acute right hip pain with shortening.  She has been evaluated by the Emergency Department and from an orthopedic standpoint they are planning surgical intervention.  1.  Closed right hip fracture.  I will defer pain as well as deep vein thrombosis management through the orthopedic team.  She is considered a moderate risk patient as far as cardiac standpoint for a moderate risk surgery based off of hypertension, age and inability to assess mets.  She would however not require any further testing prior to surgery.   2.  Hypertension.  Would hold her hydrochlorothiazide and continue her beta-blocker. 3.  Pain.  Continue as per the orthopedic team.   4.  Deep vein thrombosis prophylactic as per the orthopedic team.  5.  Unable to assess code status at this time.   TOTAL ADMISSION TIME:  32 minutes.   ____________________________ Cletis Athens. Addi Pak, MD dkh:ea D: 08/30/2012 01:47:08 ET T: 08/30/2012 02:50:05 ET JOB#: [id number]  cc: Cletis Athens.  Dema Timmons, MD, <Dictator> Paizley Ramella Synetta Shadow MD ELECTRONICALLY SIGNED 08/30/2012 5:45

## 2014-04-25 NOTE — Discharge Summary (Signed)
PATIENT NAME:  Jasmin Wang, Jasmin Wang MR#:  [id number] DATE OF BIRTH:  08/19/1919  DATE OF ADMISSION:  07/11/2012 DATE OF TRANSFER:  07/18/2012  Jasmin Wang is a 81 year old white lady who was brought to the Emergency Room by her nephew from assisted living because of generalized complaints of weakness. The patient was found to have a temperature of 101.5, with evidence of a urinary tract infection. The patient was therefore admitted for further evaluation and treatment.   The patient's past medical history was notable for: Hypertension, senile dementia, chronic vertigo, a history of breast cancer, and a previous right DVT for which she was taking Coumadin.   The patient's surgical history included: A lumpectomy for right-sided breast cancer in 2008, cholecystectomy, appendectomy, cataract extraction, and hernia repair.   THE PATIENT WAS NOTED TO BE ALLERGIC TO CODEINE.   Patient's medications on admission included: Lasix 20 mg daily, warfarin 1 mg, 1-1/2 tablets daily, vitamin C 500 mg daily, Vitamin D3 2000 units daily.   ADMISSION PHYSICAL EXAMINATION: As described by the admitting physician revealed a temperature of 97.9, a pulse of 70, respirations 20, and a blood pressure of 123/66. Pulse oximetry was 95% on room air.  GENERAL: This was an elderly frail, white lady who is conversational and pleasantly confused.   The remainder of the examination was unremarkable, as per the admitting physician. It was noted that a small tick was found by the nursing staff on her back at the time of admission.   Admission CBC showed a hemoglobin of 11.3, with a hematocrit of 34.2. White count was 98,000. Platelet count was 197,000.   Admission comprehensive metabolic panel showed a BUN of 26 with a creatinine of 0.94. Potassium was 3.2. Total protein was 6.3. Albumin was 2.5. Estimated GFR was 53.    Admission urinalysis showed gross blood. There was diarrhea, pyuria, with 3+ bacteriuria.   Subsequent urine  culture grew out staph aureus that was sensitive to sulfa drugs.   Blood cultures drawn on admission eventually showed no growth.   Protime INR on admission was 3.7.   Lyme disease antibody on admission was negative.   Admission chest x-ray showed atelectasis versus infiltrate in the lung bases, greater on the left. Fibrotic changes were also noted.   Admission EKG showed a sinus tachycardia with PACs and junctional escape beats. There was left axis deviation. There was low-voltage QRS. An anterior infarction, age indeterminate, cannot be ruled out.   HOSPITAL COURSE: The patient was admitted to the regular medical floor where she was rehydrated with IV fluids. Her electrolyte imbalance was corrected. She was treated with IV antibiotics pending receipt of her laboratory work. She was eventually converted to oral Septra, which she tolerated well. The patient was seen during her hospitalization by physical therapy. She was ambulated without difficulty. The patient remained pleasantly confused throughout her hospitalization. She was oriented to person only at the time of transfer. The patient was not agitated or belligerent, however.   DISCHARGE DIAGNOSES: 1.  Acute metabolic encephalopathy, secondary to urinary tract infection.  2.  Systemic inflammatory response syndrome (SIRS), secondary to urinary tract infection, now resolved.  3.  Senile dementia.  4.  History of breast cancer.  5.  History of deep vein thrombosis.  6.  History of hypertension.   DISCHARGE DISPOSITION: The patient is being discharged on a low sodium diet with activity as tolerated. She is being transferred to the Memory Care unit.   DISCHARGE MEDICATIONS: 1.  Tylenol 650 mg  every 4 hours p.r.n., pain or temperature greater than 100.4.  2.  Ascorbic acid 500 mg daily.  3.  Vitamin D3 1000 units daily.  4.  Protonix 40 mg daily.  5.  Septra, double-strength, 1 tablet b.i.d. for an additional 7 days.   It is  anticipated that the patient will be followed by the house physician.   SHE REMAINS A FULL CODE.     ____________________________ Jasmin PateJohn B. Danne HarborWalker III, MD jbw:dm D: 07/18/2012 07:53:31 ET T: 07/18/2012 08:32:55 ET JOB#: [id number]  cc: Jasmin PateJohn B. Danne HarborWalker III, MD, <Dictator> Elmo PuttJOHN B WALKER III MD ELECTRONICALLY SIGNED 07/23/2012 6:03

## 2014-04-25 NOTE — Consult Note (Signed)
PATIENT NAME:  Jasmin Wang, Jasmin Wang MR#:  [id number] DATE OF BIRTH:  Apr 11, 1919  DATE OF CONSULTATION:  08/30/2012  REFERRING PHYSICIAN:   CONSULTING PHYSICIAN:  Jasmin SchullerMichael J. Torry Istre, MD  REASON FOR CONSULTATION: Right hip fracture.   HISTORY OF PRESENT ILLNESS: The patient is a 81 year old dementia patient, who suffered a fall last evening. She was admitted after x-rays showed displaced high intertrochanteric hip fracture, basilar neck fracture. By report from her family, she had been ambulatory and in their words "walks all over the place." She does have significant confusion and is in a dementia care unit. On exam, the patient has the left leg flex and moving it without pain. The right leg is in the traction boot. She is able to wiggle her toes and does have palpable pulse. Skin is intact.   X-rays reveal a basilar neck fracture with slight comminution.   RECOMMENDATION: ORIF later today and with a history of DVT we will anticoagulate her postop.   ____________________________ Jasmin SchullerMichael J. Sharanya Templin, MD mjm:aw D: 08/30/2012 07:31:32 ET T: 08/30/2012 07:42:19 ET JOB#: [id number]  cc: Jasmin SchullerMichael J. Jaiyon Wander, MD, <Dictator> Jasmin SchullerMICHAEL J Matalyn Nawaz MD ELECTRONICALLY SIGNED 08/30/2012 9:11

## 2014-04-25 NOTE — Discharge Summary (Signed)
PATIENT NAME:  Jasmin Wang, Jasmin Wang MR#:  [id number] DATE OF BIRTH:  1919/05/20  DATE OF ADMISSION:  08/30/2012 DATE OF TRANSFER: 09/04/2012    HISTORY OF PRESENT ILLNESS:  Jasmin Wang is a 81 year old white lady who presented to the ER from a nursing facility after a fall. In the Emergency Room, the patient was found to have a right hip fracture. She was taken to surgery on the day of admission by Dr. Cheri FowlerMike Menz who did a right hip pinning.   PAST MEDICAL HISTORY: Notable for hypertension, senile dementia, history of breast cancer previous right lower extremity DVT.   PAST SURGICAL HISTORY: Included a right lobectomy in 2008, cholecystectomy, appendectomy, cataract surgery, and hernia repair.   ALLERGIES: CODEINE.   MEDICATIONS ON ADMISSION: 1.  Protonix 40 mg daily. 2.  Metoprolol 25 mg b.i.d.  3.  Calcium C 500 mg daily. 4.  Vitamin D3 1000 units daily.  5.  Hydrochlorothiazide 12.5 mg daily.   The patient's admission physical examination is well outlined in the dictated admission note by the admitting hospitalist.   HOSPITAL COURSE:  The patient's hospital course was one that was rather unremarkable. As mentioned above, she was taken for a hip pinning on the day of admission. She was then seen by both physical therapy and medicine. She was rehydrated. Her blood pressure remained relatively well controlled throughout her hospitalization.   DISCHARGE DIAGNOSIS: Status post right hip pinning.   DISCHARGE MEDICATIONS: 1.  Tylenol 325 mg 2 tablets every 4 hours p.r.n.  2.  Norco 5/325 mg 1 tablet every 6 hours p.r.n. pain.  3.  Ascorbic acid 500 mg daily.  4.  Dulcolax suppository 10 mg at bedtime p.r.n.  5.  Vitamin D3 2000 units daily.  6.  Surfak 240 mg at bedtime.  7.  Senna/docusate 1 tablet daily.  8.  Ferrous sulfate 325 mg twice daily with meals.  9.  Metoprolol 25 mg b.i.d.  10.  Protonix 40 mg daily.  11.  Milk of magnesia 30 mg at bedtime p.r.n.   DISCHARGE DISPOSITION: The  patient was discharged on a regular diet with activity as tolerated.   FOLLOWUP: She is to be followed up by Orthopedics. She is currently going to rehab for further physical therapy.   The patient remains a FULL CODE.    ____________________________ Letta PateJohn B. Danne HarborWalker III, MD jbw:dp D: 09/04/2012 11:23:21 ET T: 09/04/2012 11:49:13 ET JOB#: [id number]  cc: Jonny RuizJohn B. Danne HarborWalker III, MD, <Dictator> Elmo PuttJOHN B WALKER III MD ELECTRONICALLY SIGNED 09/09/2012 11:25

## 2014-04-25 NOTE — H&P (Signed)
PATIENT NAME:  Jasmin Wang, Jasmin Wang MR#:  [id number] DATE OF BIRTH:  Mar 27, 1919  DATE OF ADMISSION:  07/11/2012  PRIMARY CARE PHYSICIAN: Dr. Yates DecampJohn Walker.   CHIEF COMPLAINT: Weakness.   HISTORY OF PRESENT ILLNESS: A 81 year old female patient with history of dementia, breast cancer, hypertension and right lower extremity deep vein thrombosis, presents to the Emergency Room brought in by her nephew with complaints of weakness. The patient was found to have fever of 101.5 with urinary tract infection, acute encephalopathy and is being admitted to the hospitalist service. The patient presently is more awake and close to her baseline, as per the nephew at bedside here on the floor. The patient does seem confused. All she mentions that she wants to go back to her previous room. Her nephew at bedside gives most of the history and history obtained from old records.   She does not seem to have any concerns, pains, shortness of breath, nausea and vomiting.   PAST MEDICAL HISTORY: 1.  Dementia.  2.  Hypertension.  3.  Breast cancer.  4.  Vertigo.  5.  Right lower extremity deep vein thrombosis on Coumadin.   PAST SURGICAL HISTORY:  1.  Right breast cancer, lumpectomy in 2008.  2.  Cholecystectomy.  3.  Appendectomy.  4.  Cataract surgery.  5.  Hernia repair.   FAMILY HISTORY: Hypertension.   SOCIAL HISTORY: No smoking, alcohol, illicit drugs. The patient lives alone in an assisted living facility.   REVIEW OF SYSTEMS: Unobtainable secondary to her dementia.   ALLERGIES: CODEINE.   HOME MEDICATIONS: Include:  1.  Lasix 20 mg oral once a day.  2.  Vitamin C 500 mg oral once a day.  3.  Vitamin D3 2000 international units oral once a day.  4.  Warfarin 1 mg tablets 1.5 tablets oral once a day.   PHYSICAL EXAMINATION: VITAL SIGNS: Temperature 97.9, pulse 70, respirations 20, blood pressure 123/66, saturating 95% on room air.  GENERAL: Frail, elderly Caucasian female patient lying in bed,  comfortable, conversational, and pleasantly confused.  PSYCHIATRIC:  Is alert and oriented to person, but not to place or time.  HEENT: Atraumatic, normocephalic. Oral mucosa moist and pink. External ears and nose normal. No pallor. No icterus. Pupils bilaterally equal and reactive to light.  NECK: Supple. No thyromegaly. No palpable lymph nodes. Trachea midline. No carotid bruit, JVD.  CARDIOVASCULAR: S1, S2, without any murmurs. Peripheral pulses 2+.  RESPIRATORY: Normal work of breathing. Clear to auscultation on both sides.  GASTROINTESTINAL: Soft abdomen, nontender. Bowel sounds present. No hepatosplenomegaly palpable.  SKIN: Warm and dry. No petechiae, rash, ulcers.  MUSCULOSKELETAL: No joint swelling, redness, effusion of the large joints. Normal muscle tone. NEUROLOGICAL:  Motor strength is 5/5 in upper and lower extremities. Sensation is intact all over.  LYMPHATIC: No cervical, supraclavicular lymphadenopathy.   A small tick has been found by nursing staff on the back.   LABORATORY AND DIAGNOSTIC DATA: Glucose 97, BUN 26, creatinine 0.94, potassium 3.2. AST, ALT, alkaline phosphatase, bilirubin normal. Troponin less than 0.02. WBC 9.8, hemoglobin 11.3, platelets of 197, INR 3.7. Urinalysis shows 2900 RBC, 344 WBC and 3+ bacteria.   Chest x-ray shows bilateral basilar atelectasis.    ASSESSMENT AND PLAN: 1.  Acute encephalopathy with sepsis and urinary tract infection. We will start the patient on ceftriaxone. We will await blood and urine culture results to come back. Presently, the patient   seems to be improving slowly. Needs to be monitored closely.  2.  Supra-therapeutic INR with history of deep vein thrombosis. We will hold the Coumadin at this time.  3.  Tick bite. Lyme titer has been. Tick is being removed by the floor staff.  4.  Hypertension. Continue home medications.  5.  Deep vein thrombosis prophylaxis: The patient is on Coumadin.   CODE STATUS: Full code.   TIME  SPENT TODAY ON THIS CASE: 40 minutes.     ____________________________ Molinda Bailiff Durenda Pechacek, MD srs:cc D: 07/11/2012 15:58:20 ET T: 07/11/2012 16:46:03 ET JOB#: [id number]  cc: Wardell Heath R. Aluna Whiston, MD, <Dictator> John B. Danne Harbor, MD Wardell Heath [place] MD ELECTRONICALLY SIGNED 07/22/2012 23:40

## 2014-11-25 IMAGING — CR RIGHT HIP - COMPLETE 2+ VIEW
1 series · 2 of 2 positions shown · non-contrast
Comparison: none

REASON FOR EXAM: R hip pain
COMMENTS:

PROCEDURE:     DXR - DXR HIP RIGHT COMPLETE  - August 29, 2012 [DATE]
RESULT:     Comparison:  None

[Series 1: t hip ap right · 0.14mm/px · 2 of 2 slices shown]
[im 1/2]
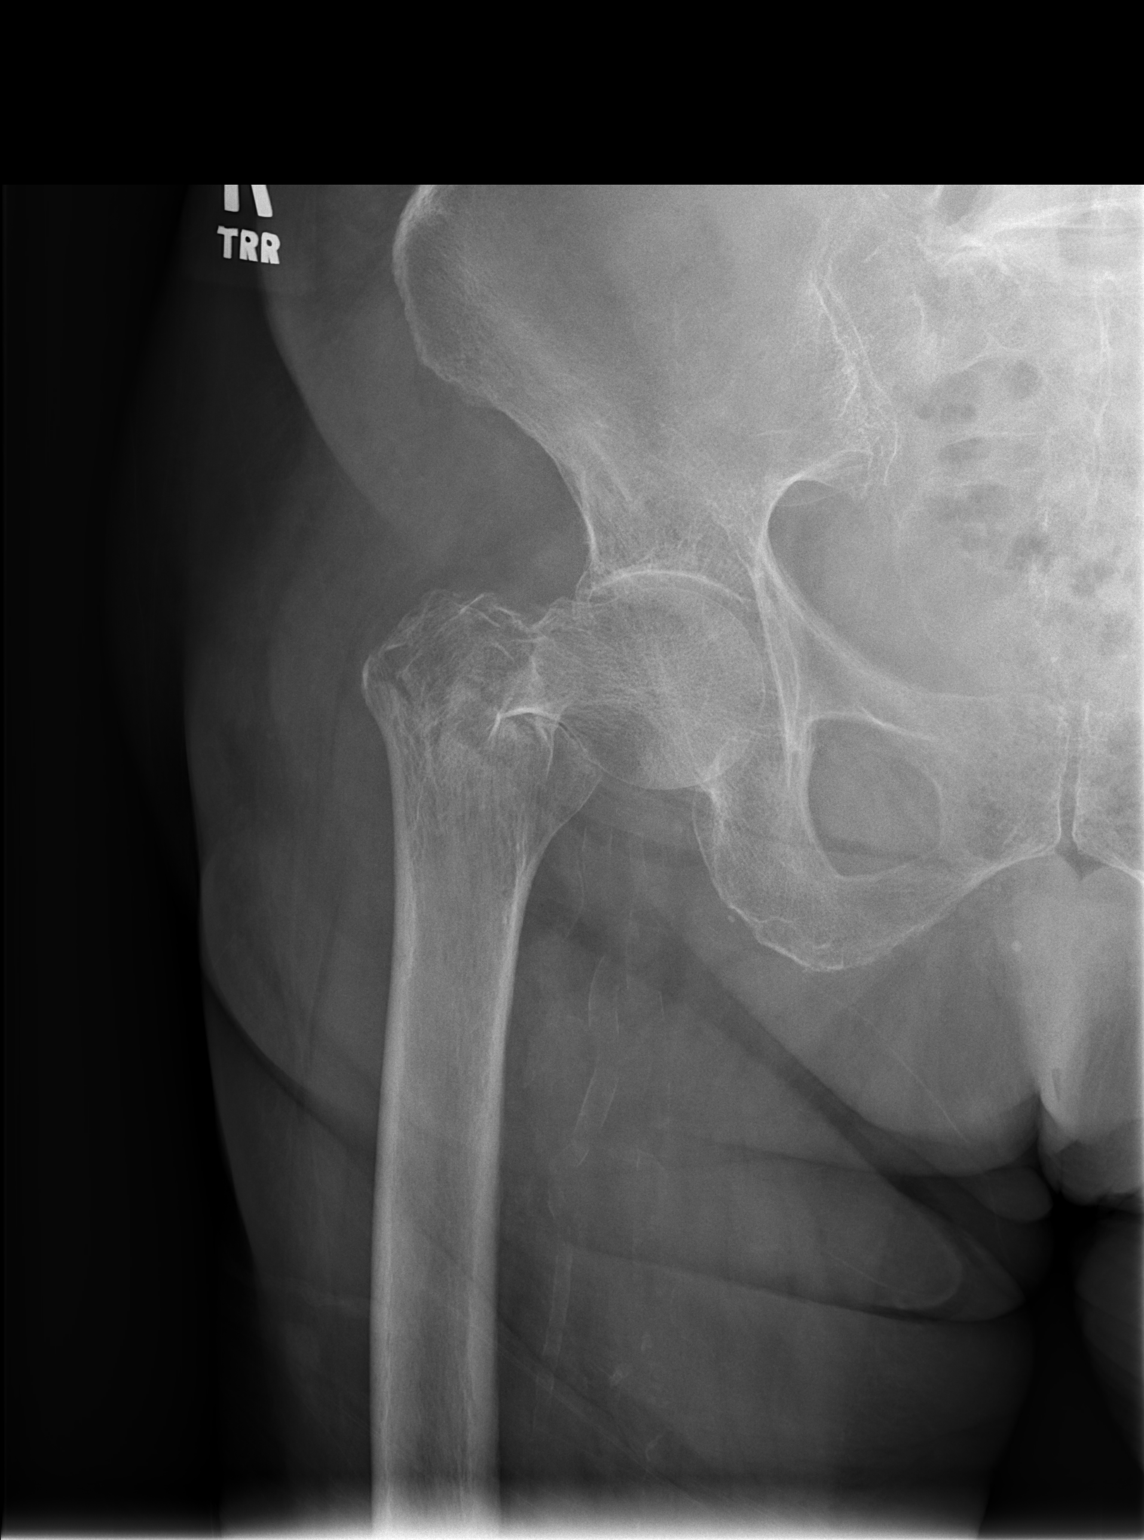
[im 2/2]
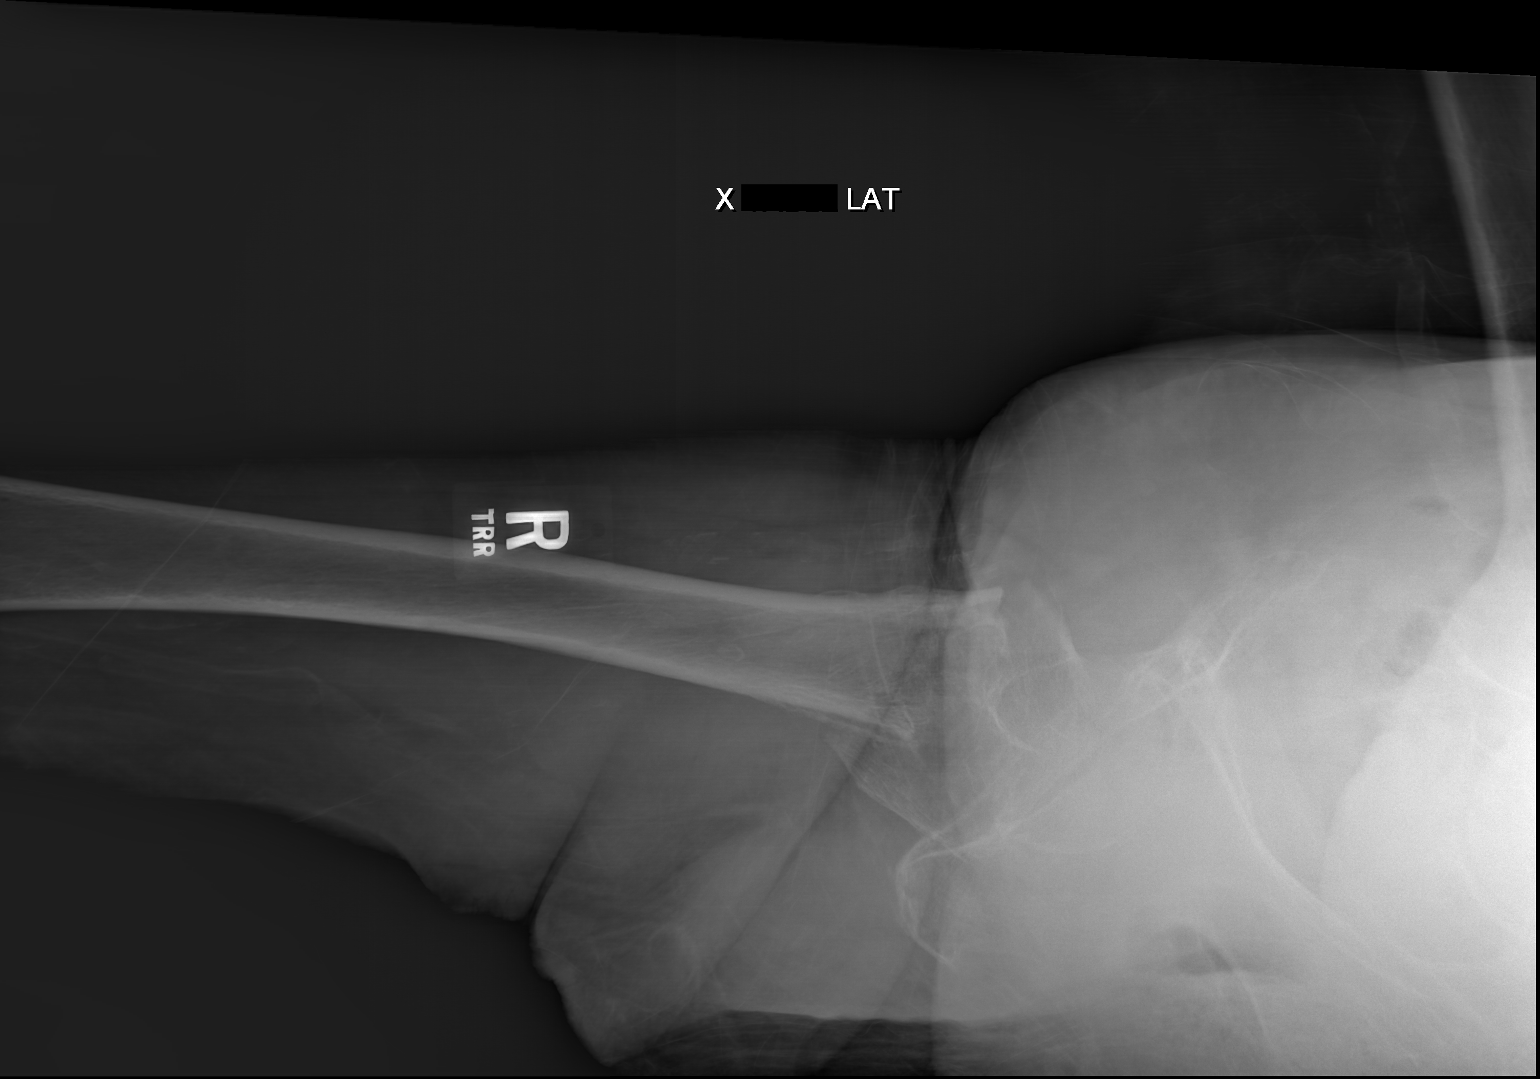

[2 of 2 positions shown; findings below may reference images not displayed]

FINDINGS: AP and lateral view of the right hip demonstrates a mildly comminuted right
femoral neck fracture. There is no dislocation.
IMPRESSION: Comminuted right femoral neck fracture.

[REDACTED]

## 2014-11-25 IMAGING — CT CT HEAD WITHOUT CONTRAST
1 series · 16 of 30 positions shown, 20 images · non-contrast
Comparison: none

REASON FOR EXAM: s/p fall
COMMENTS:

PROCEDURE:     CT  - CT HEAD WITHOUT CONTRAST  - August 29, 2012 [DATE]
RESULT:     Comparison:  None
TECHNIQUE: Multiple axial images from the foramen magnum to the vertex were
obtained without IV contrast.

[Series 2: soft tissue · axial · 0.40mm/px · z∈[-57,+83]mm · 16 of 32 slices shown, 20 images]
[im 2/32  brain]
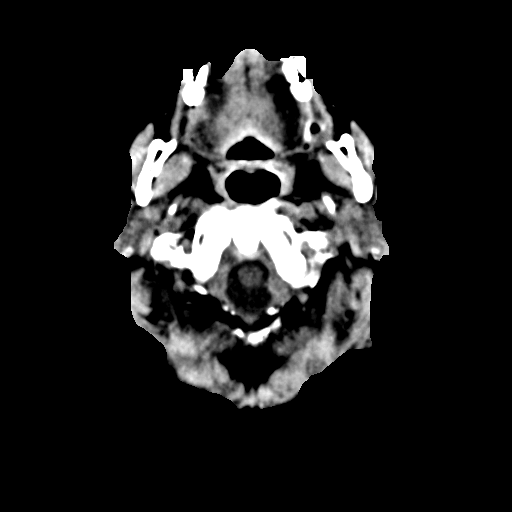
[im 2/32  bone]
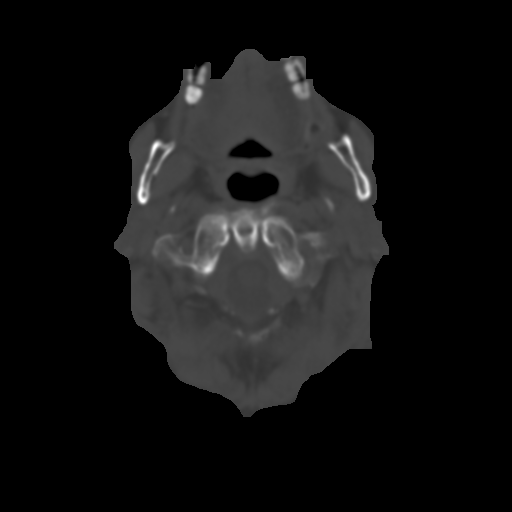
[im 4/32  brain]
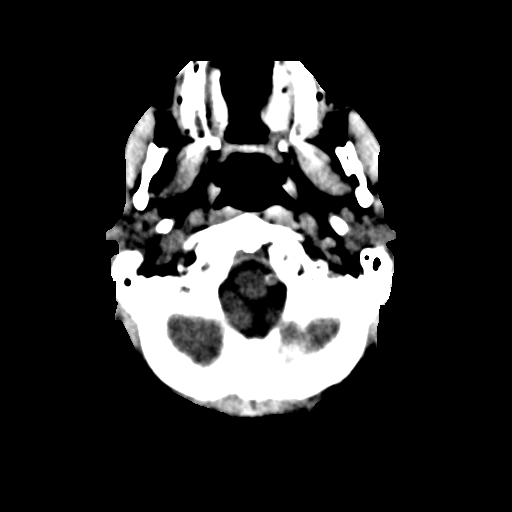
[im 6/32  brain]
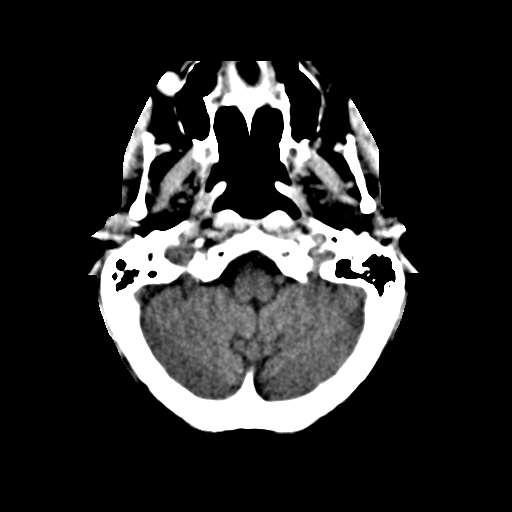
[im 8/32  brain]
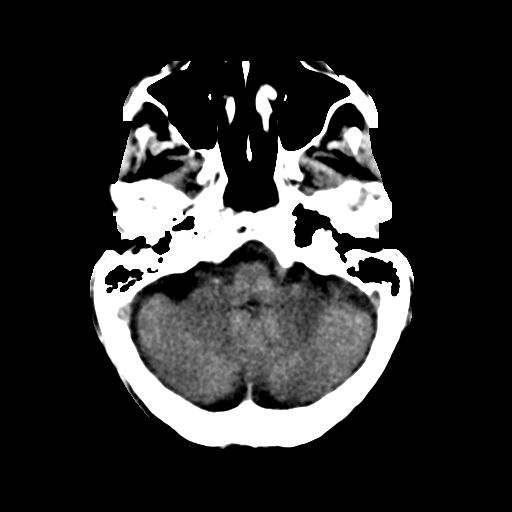
[im 9/32  brain]
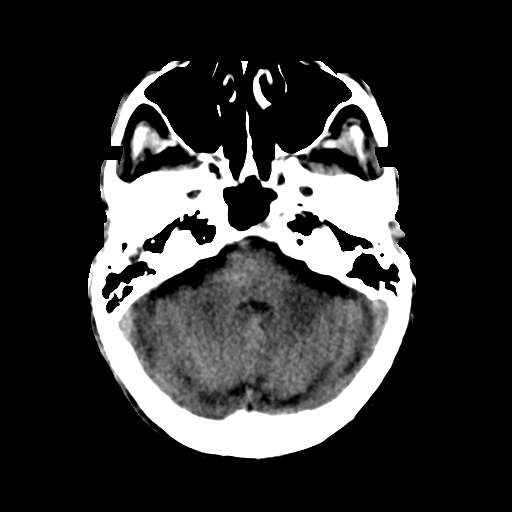
[im 9/32  bone]
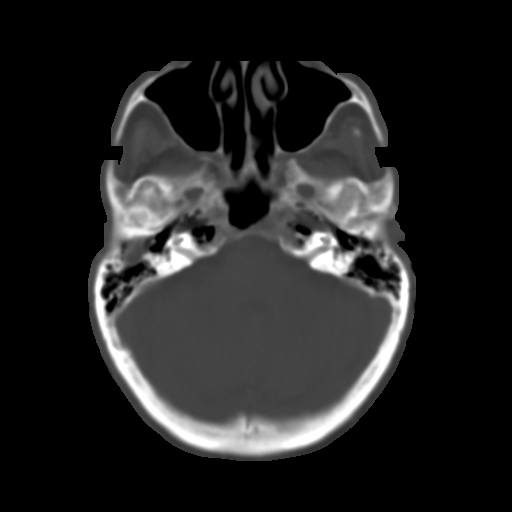
[im 11/32  brain]
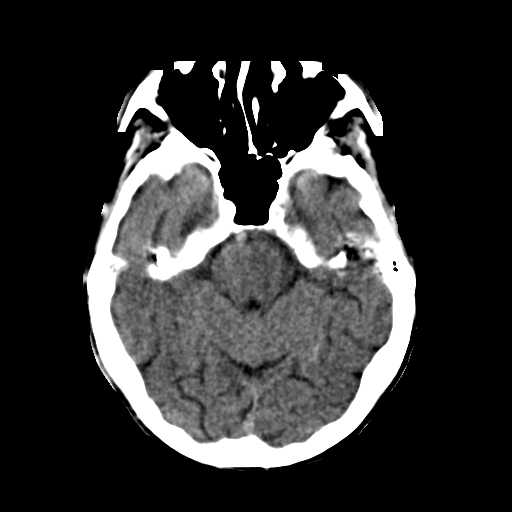
[im 13/32  brain]
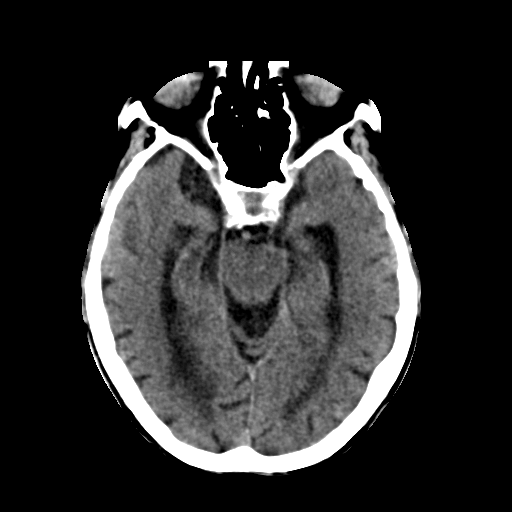
[im 15/32  brain]
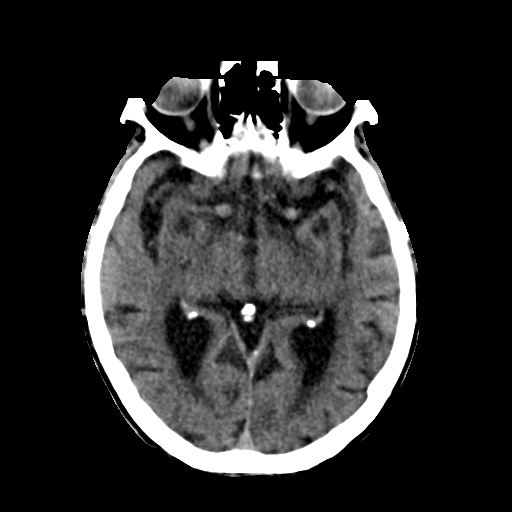
[im 17/32  brain]
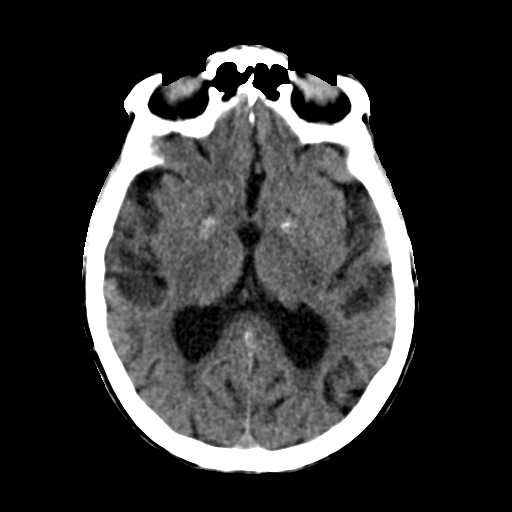
[im 17/32  bone]
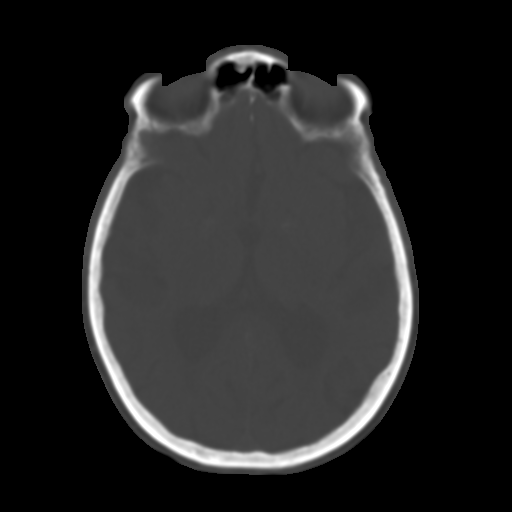
[im 19/32  brain]
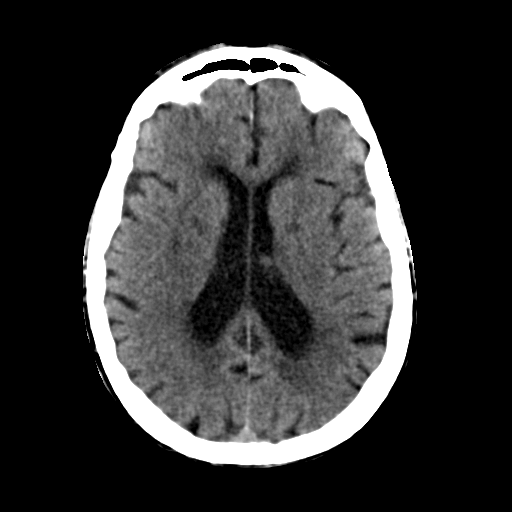
[im 21/32  brain]
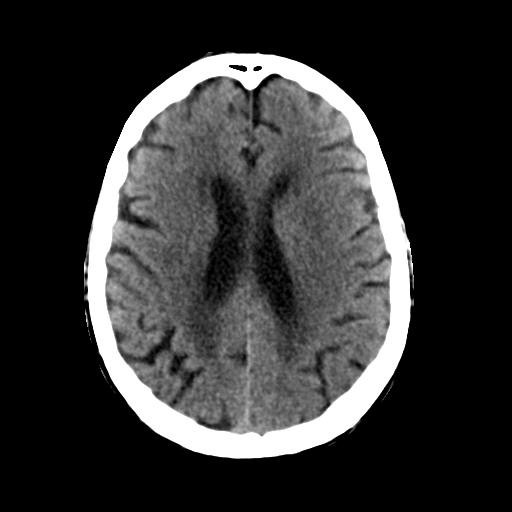
[im 23/32  brain]
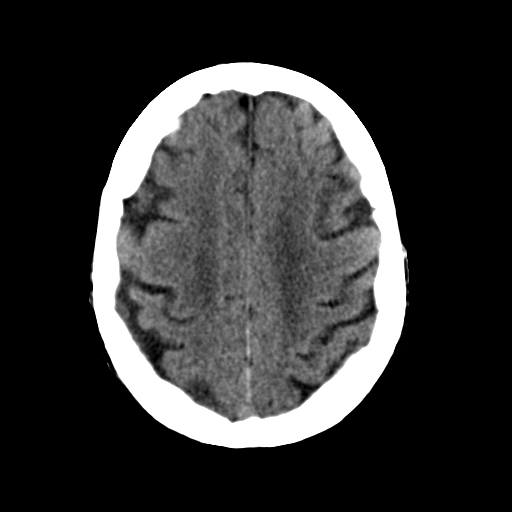
[im 24/32  brain]
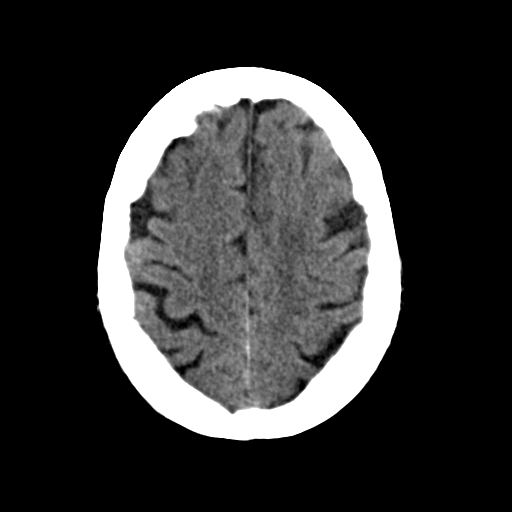
[im 24/32  bone]
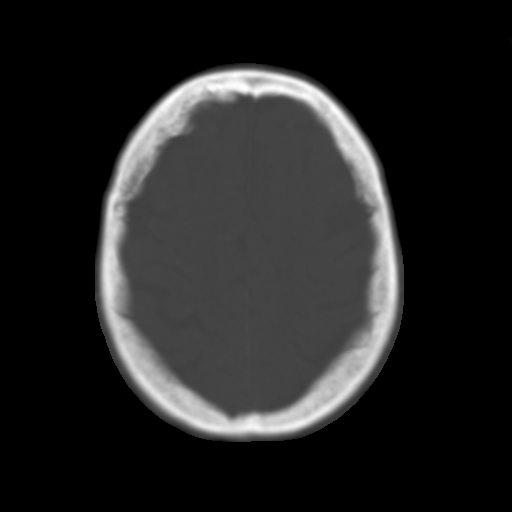
[im 26/32  brain]
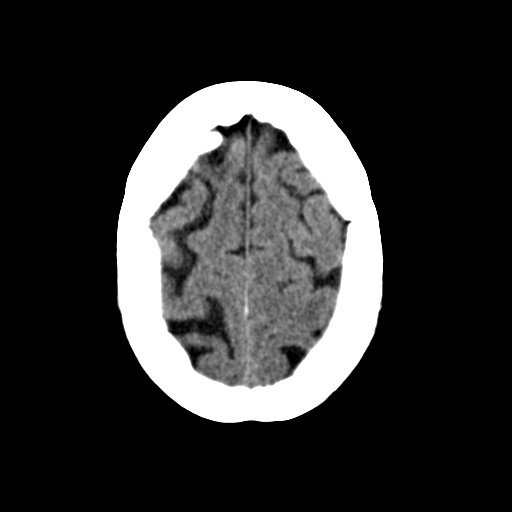
[im 28/32  brain]
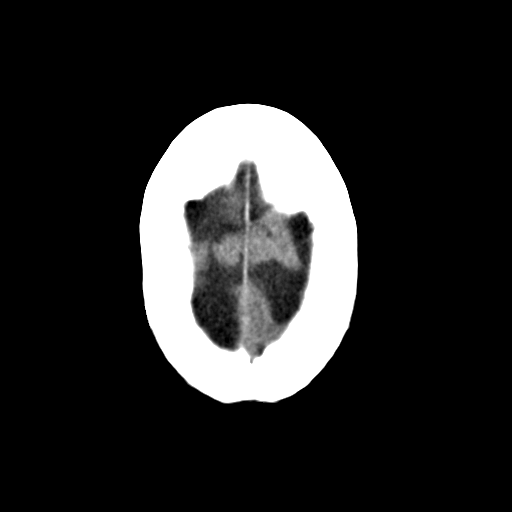
[im 30/32  brain]
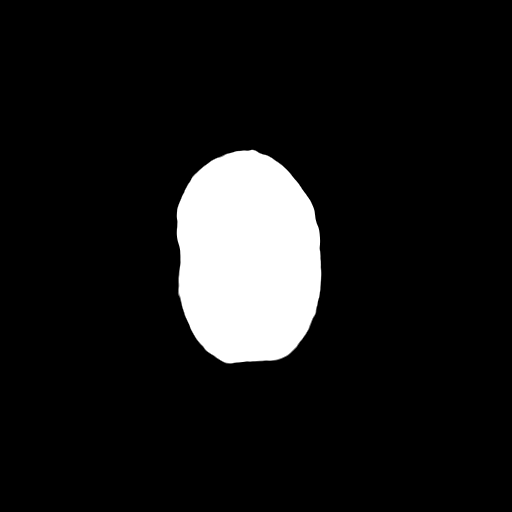

[16 of 30 positions shown; findings below may reference images not displayed]

FINDINGS: There is no evidence of mass effect, midline shift, or extra-axial fluid
collections.  There is no evidence of a space-occupying lesion or
intracranial hemorrhage. There is no evidence of a cortical-based area of
acute infarction. There is generalized cerebral atrophy. There is
periventricular white matter low attenuation likely secondary to
microangiopathy.

The ventricles and sulci are appropriate for the patient's age. The basal
cisterns are patent.

Visualized portions of the orbits are unremarkable. The visualized portions
of the paranasal sinuses and mastoid air cells are unremarkable.
Cerebrovascular atherosclerotic calcifications are noted.

The osseous structures are unremarkable.
IMPRESSION: No acute intracranial process.

[REDACTED]
